# Patient Record
Sex: Female | Born: 1975 | Race: White | Hispanic: No | Marital: Married | Smoking: Never smoker
Health system: Southern US, Community
[De-identification: ages and names within clinical notes are randomized; demographics above are authoritative.]

## PROBLEM LIST (undated history)

## (undated) DIAGNOSIS — K219 Gastro-esophageal reflux disease without esophagitis: Secondary | ICD-10-CM

## (undated) DIAGNOSIS — C801 Malignant (primary) neoplasm, unspecified: Secondary | ICD-10-CM

## (undated) HISTORY — DX: Gastro-esophageal reflux disease without esophagitis: K21.9

## (undated) HISTORY — DX: Malignant (primary) neoplasm, unspecified: C80.1

## (undated) HISTORY — PX: UPPER GI ENDOSCOPY: SHX6162

---

## 2004-09-10 ENCOUNTER — Emergency Department: Payer: Self-pay | Admitting: Emergency Medicine

## 2005-08-29 ENCOUNTER — Emergency Department: Payer: Self-pay | Admitting: Emergency Medicine

## 2007-03-14 ENCOUNTER — Inpatient Hospital Stay: Payer: Self-pay

## 2007-10-03 IMAGING — CR DG CHEST 2V
1 series · 2 of 2 positions shown · non-contrast
Comparison: none

REASON FOR EXAM: Pain in sternum
COMMENTS:  LMP: Two weeks ago

PROCEDURE:     DXR - DXR CHEST PA (OR AP) AND LATERAL  - August 29, 2005  [DATE]
RESULT:       Incidental note is made to mild levoscoliosis of the mid to
lower thoracic spine.  Otherwise, unremarkable chest radiograph.

[Series 3637: postero_anterior · 0.22mm/px · 2 of 2 slices shown]
[im 1/2]
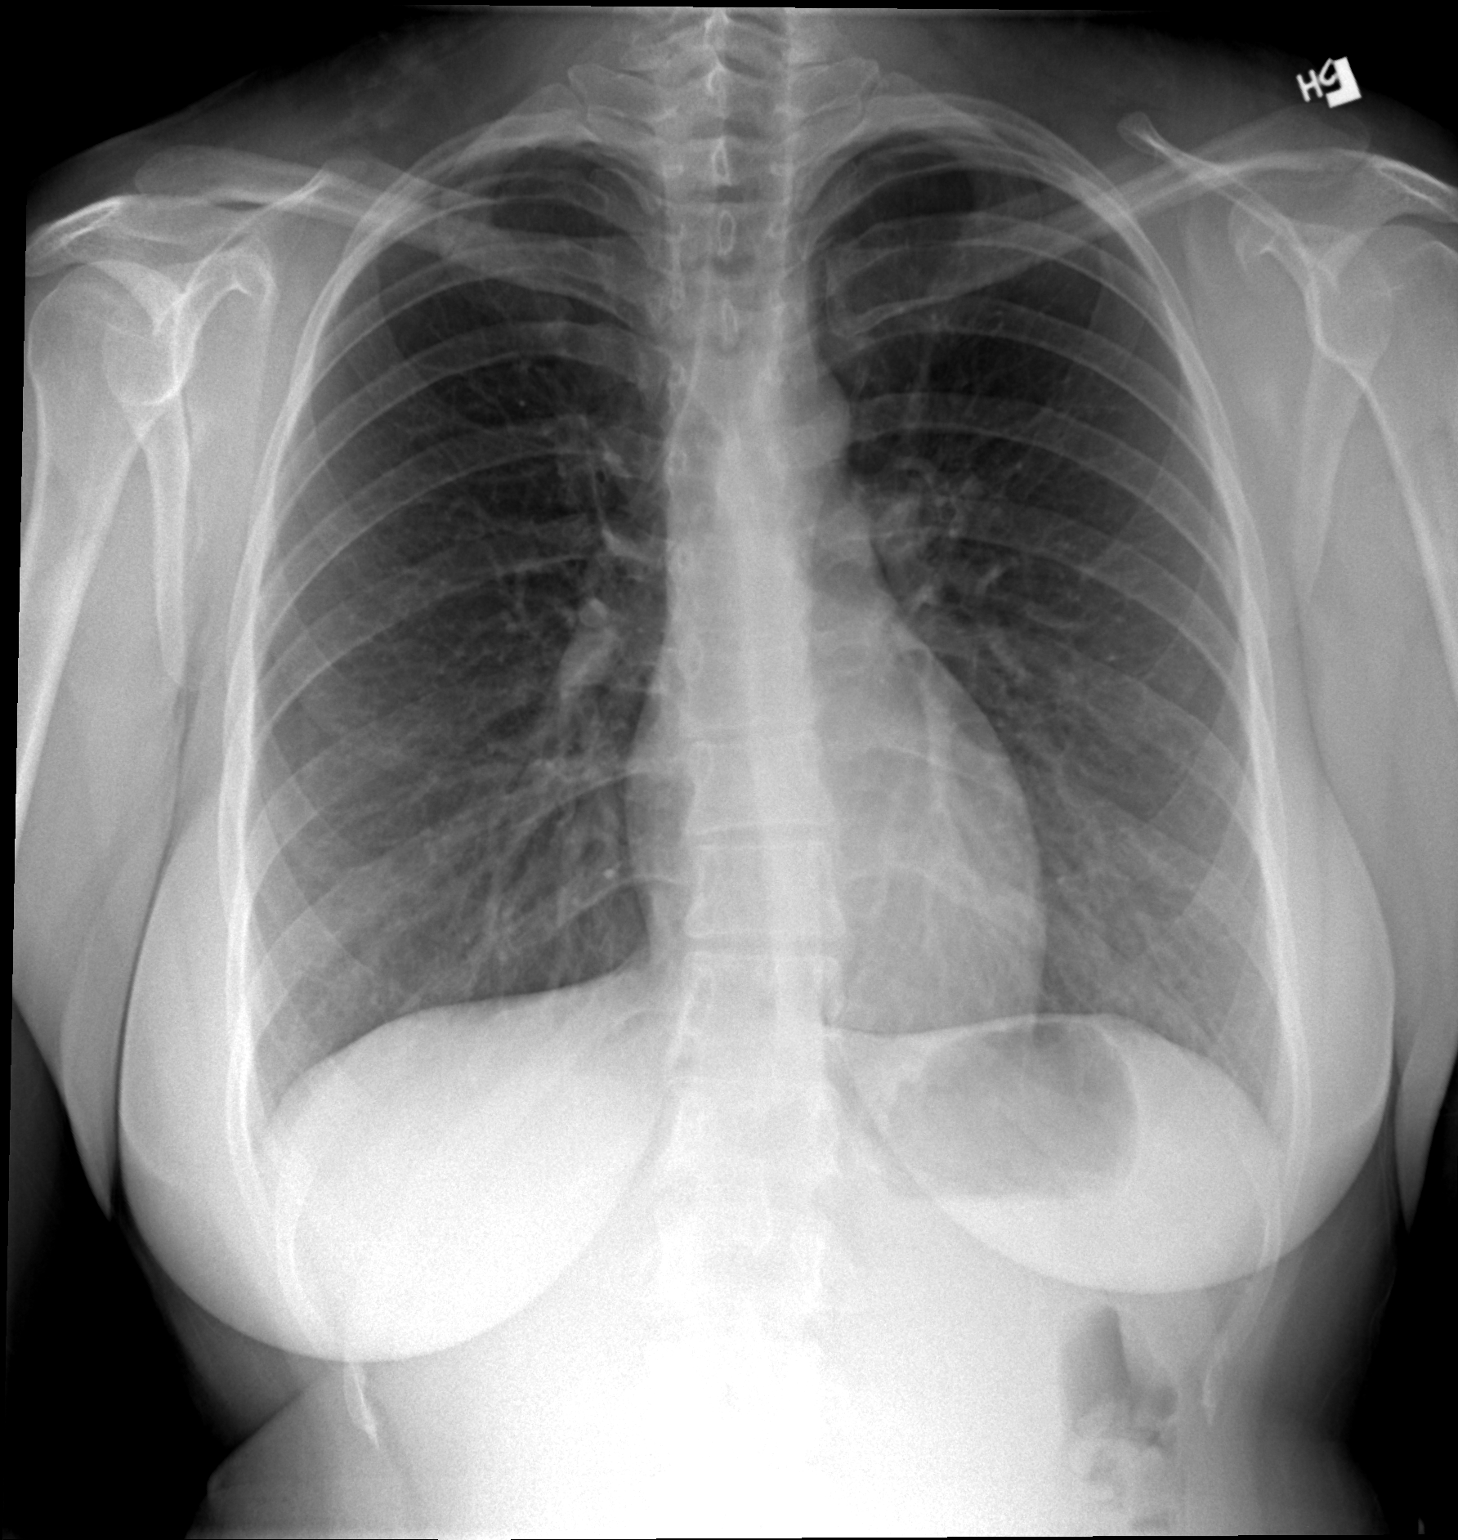
[im 2/2]
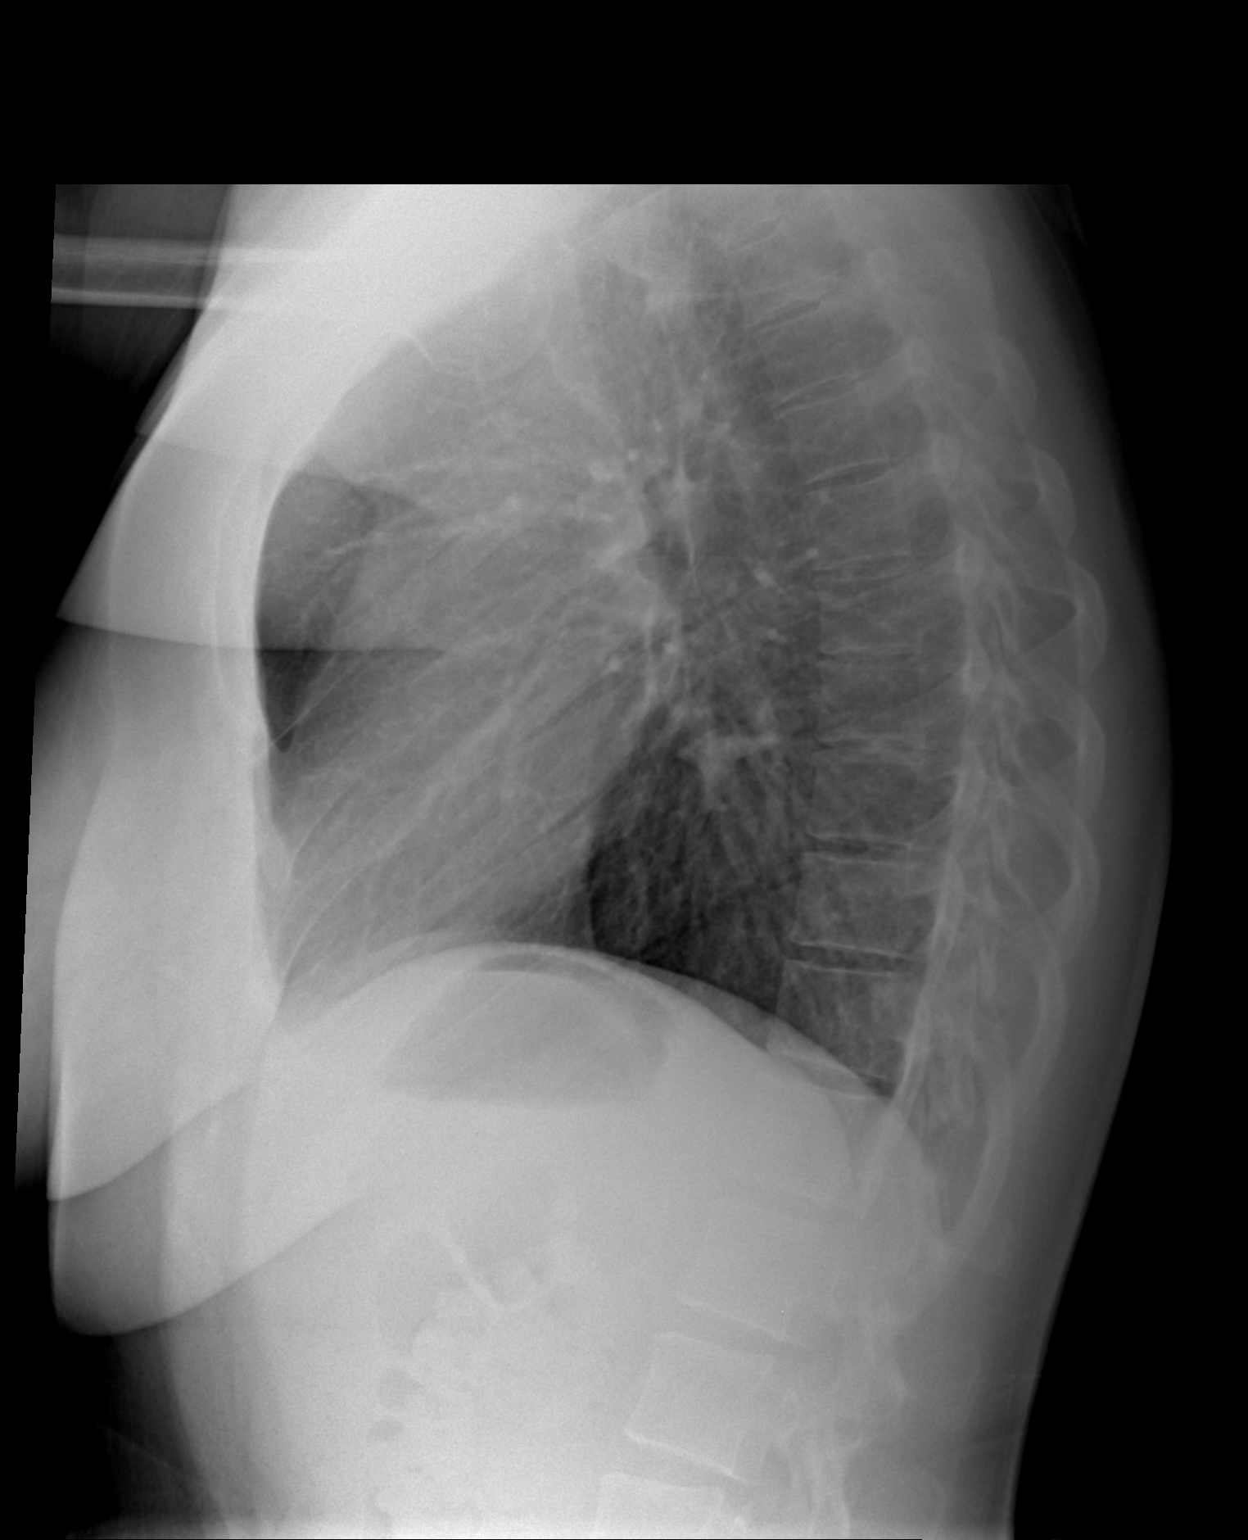

[2 of 2 positions shown; findings below may reference images not displayed]

IMPRESSION: See above.

## 2008-06-17 ENCOUNTER — Ambulatory Visit: Payer: Self-pay

## 2010-07-22 IMAGING — US ULTRASOUND RIGHT BREAST
1 series · 11 of 11 positions shown · non-contrast
Comparison: none

REASON FOR EXAM: Right breast mass between 11 and 12 o'clock  above nipple
COMMENTS:

[Series 1: ultrasound right breast · 11 of 11 slices shown]
[im 1/11]
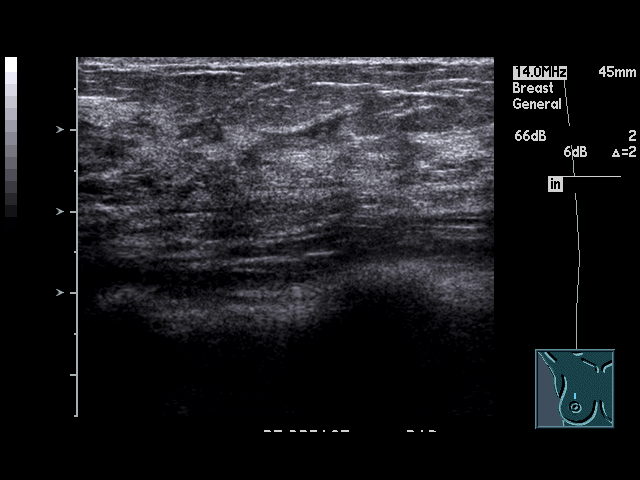
[im 2/11]
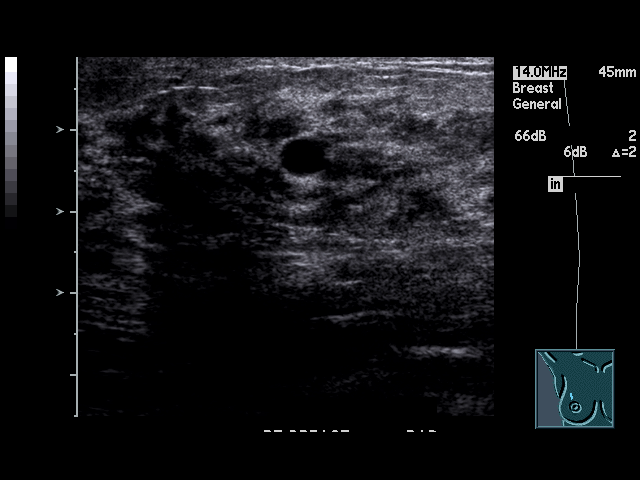
[im 3/11]
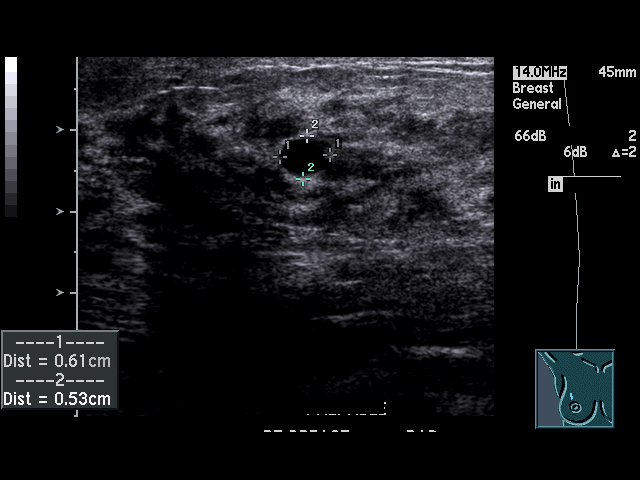
[im 4/11]
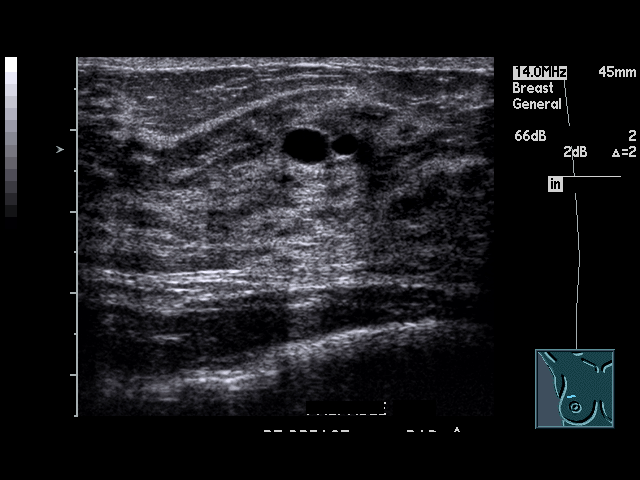
[im 5/11]
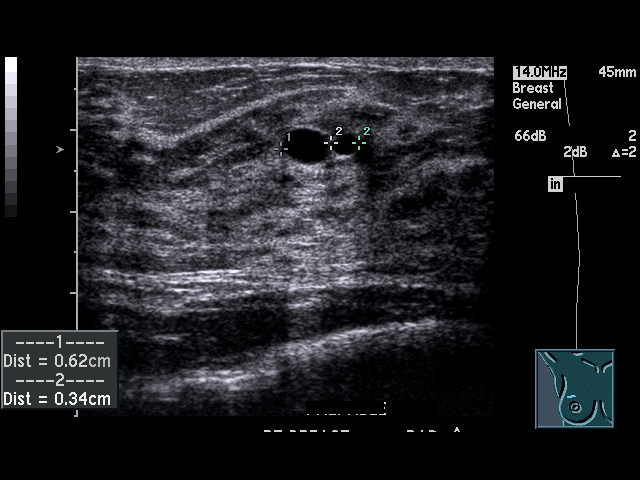
[im 6/11]
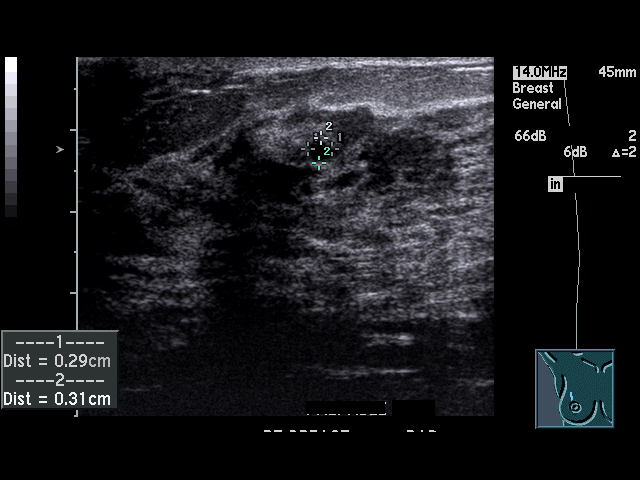
[im 7/11]
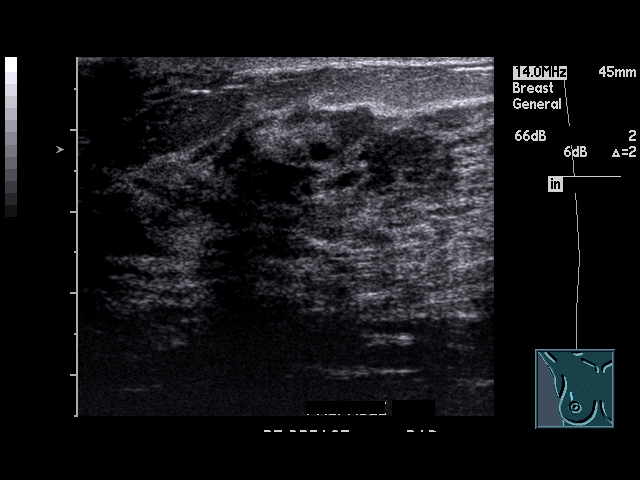
[im 8/11]
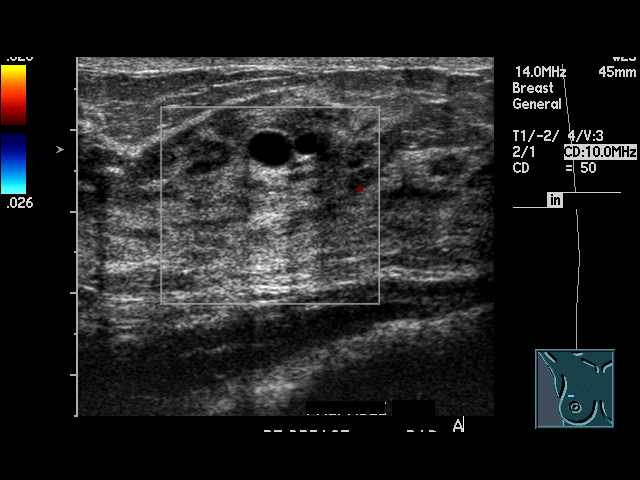
[im 9/11]
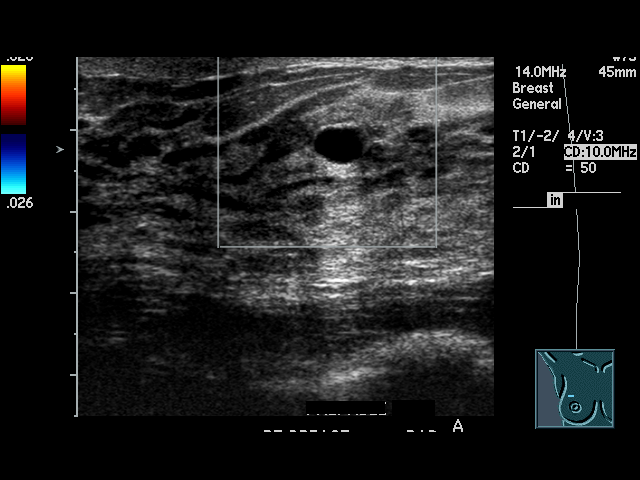
[im 10/11]
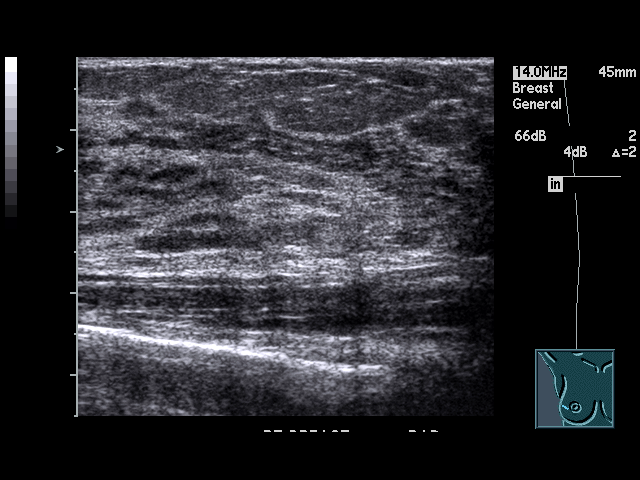
[im 11/11]
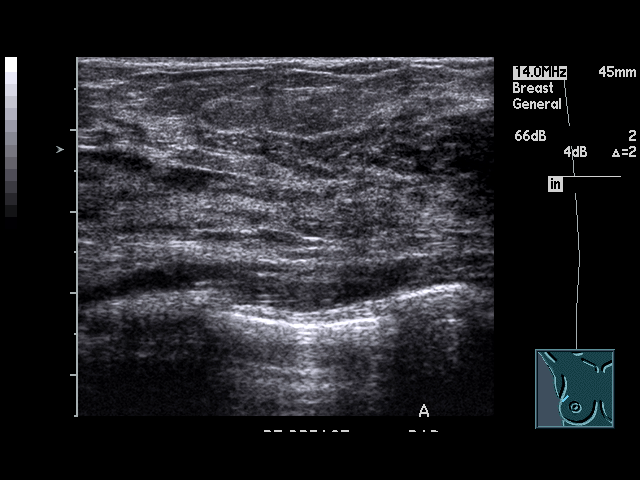

[11 of 11 positions shown; findings below may reference images not displayed]

PROCEDURE:     US  - US BREAST RIGHT  - June 17, 2008  [DATE]

RESULT:     The patient has an area of palpable nodularity between the 11
and 12 o'clock position above the nipple on the RIGHT.

Ultrasound performed in this region reveals two, simple appearing cystic
structures in the area of the palpable findings. The larger measures
approximately 6.0 mm in diameter with the smaller measuring approximately
3.0 mm in diameter. They do demonstrate enhanced through transmission. No
suspicious appearing solid components are identified.
IMPRESSION: There are two, simple appearing, cystic structures in the
area of the palpable abnormality in the RIGHT periareolar region
superolaterally. Please see the dictation of the mammogram of this same day
for final recommendations and BI-RADS classification.

## 2015-01-13 ENCOUNTER — Ambulatory Visit: Payer: BLUE CROSS/BLUE SHIELD | Admitting: Family Medicine

## 2015-01-17 ENCOUNTER — Encounter: Payer: Self-pay | Admitting: Family Medicine

## 2015-01-17 ENCOUNTER — Ambulatory Visit (INDEPENDENT_AMBULATORY_CARE_PROVIDER_SITE_OTHER): Payer: BLUE CROSS/BLUE SHIELD | Admitting: Family Medicine

## 2015-01-17 ENCOUNTER — Other Ambulatory Visit: Payer: Self-pay | Admitting: Family Medicine

## 2015-01-17 VITALS — BP 116/72 | HR 107 | Temp 98.2°F | Resp 16 | Ht 67.0 in | Wt 189.4 lb

## 2015-01-17 DIAGNOSIS — E663 Overweight: Secondary | ICD-10-CM | POA: Diagnosis not present

## 2015-01-17 DIAGNOSIS — Z85828 Personal history of other malignant neoplasm of skin: Secondary | ICD-10-CM | POA: Insufficient documentation

## 2015-01-17 DIAGNOSIS — K219 Gastro-esophageal reflux disease without esophagitis: Secondary | ICD-10-CM | POA: Insufficient documentation

## 2015-01-17 MED ORDER — PHENDIMETRAZINE TARTRATE 35 MG PO TABS: 1.0000 | ORAL_TABLET | Freq: Every day | ORAL | Status: DC | PRN

## 2015-01-17 MED ORDER — LORCASERIN HCL 10 MG PO TABS: 1.0000 | ORAL_TABLET | Freq: Two times a day (BID) | ORAL | Status: DC

## 2015-01-17 NOTE — Progress Notes (Signed)
Name: Briana Cabrera   MRN: [id number]    DOB: 10-Jun-1976   Date:01/17/2015       Progress Note  Subjective  Chief Complaint  Chief Complaint  Patient presents with  . Medication Refill    patient stated that the change of medication has really helped her.    HPI  Patient is here for routine follow up of Obesity. The patient is appropriately concerned about their weight. Onset of weight issues started a few years ago. Highest recorded weight 189 lbs. Goal weight loss is 20-25lbs. Associated symptoms or diseases include no pain in weight bearing joints, HTN, HLD, DMII, depression, poor self esteem, snoring, sleep apnea). Current efforts to reduce weight include regular exercise and diet modification and prescription Phendimetrazine. She reports no side effects from her medication. Describe current exercise regimen: infrequently Describe current diet regimen: balanced diet but does have a hard time with carbohydrate restriction consistently.    Past Medical History  Diagnosis Date  . Cancer     skin  . GERD (gastroesophageal reflux disease)     History reviewed. No pertinent past surgical history.  Family History  Problem Relation Age of Onset  . Hypertension Mother   . Heart disease Father   . Hypertension Father   . Diabetes Father   . Heart disease Brother   . Hypertension Brother   . Diabetes Brother     History   Social History  . Marital Status: Married    Spouse Name: N/A  . Number of Children: 1  . Years of Education: N/A   Occupational History  . Patient Care Coordinator     [employer]   Social History Main Topics  . Smoking status: Never Smoker   . Smokeless tobacco: Not on file  . Alcohol Use: No  . Drug Use: No  . Sexual Activity:    Partners: Male    Birth Control/ Protection: IUD   Other Topics Concern  . Not on file   Social History Narrative  . No narrative on file     Current outpatient prescriptions:  .  doxycycline (MONODOX) 100  MG capsule, Take 1 capsule by mouth daily., Disp: , Rfl: 0 .  hydrocortisone 2.5 % cream, Apply 1 application topically daily., Disp: , Rfl: 0 .  omeprazole (PRILOSEC) 40 MG capsule, Take 1 capsule by mouth daily., Disp: , Rfl:  .  Phendimetrazine Tartrate 35 MG TABS, Take 1 tablet (35 mg total) by mouth daily as needed., Disp: 30 each, Rfl: 1 .  ketoconazole (NIZORAL) 2 % shampoo, Apply 1 application topically 2 (two) times a week., Disp: , Rfl: 0 .  Lorcaserin HCl (BELVIQ) 10 MG TABS, Take 1 tablet by mouth 2 (two) times daily., Disp: 60 tablet, Rfl: 2  No Known Allergies   ROS  CONSTITUTIONAL: No significant weight changes, fever, chills, weakness or fatigue.  HEENT:  - Eyes: No visual changes.  - Ears: No auditory changes. No pain.  - Nose: No sneezing, congestion, runny nose. - Throat: No sore throat. No changes in swallowing. SKIN: No rash or itching.  CARDIOVASCULAR: No chest pain, chest pressure or chest discomfort. No palpitations or edema.  RESPIRATORY: No shortness of breath, cough or sputum.  GASTROINTESTINAL: No anorexia, nausea, vomiting. No changes in bowel habits. No abdominal pain or blood.  GENITOURINARY: No dysuria. No frequency. No discharge.  NEUROLOGICAL: No headache, dizziness, syncope, paralysis, ataxia, numbness or tingling in the extremities. No memory changes. No change in bowel or  bladder control.  MUSCULOSKELETAL: No joint pain. No muscle pain. HEMATOLOGIC: No anemia, bleeding or bruising.  LYMPHATICS: No enlarged lymph nodes.  PSYCHIATRIC: No change in mood. No change in sleep pattern.  ENDOCRINOLOGIC: No reports of sweating, cold or heat intolerance. No polyuria or polydipsia.   Objective  Filed Vitals:   01/17/15 1142  BP: 116/72  Pulse: 107  Temp: 98.2 F (36.8 C)  TempSrc: Oral  Resp: 16  Height: 5\' 7"  (1.702 m)  Weight: 189 lb 6.4 oz (85.911 kg)  SpO2: 96%    Physical Exam  Constitutional: Patient appears well-developed and  well-nourished. In no distress.  HEENT:  - Head: Normocephalic and atraumatic.  - Ears: Bilateral TMs gray, no erythema or effusion - Nose: Nasal mucosa moist - Mouth/Throat: Oropharynx is clear and moist. No tonsillar hypertrophy or erythema. No post nasal drainage.  - Eyes: Conjunctivae clear, EOM movements normal. PERRLA. No scleral icterus.  Neck: Normal range of motion. Neck supple. No JVD present. No thyromegaly present.  Cardiovascular: Normal rate, regular rhythm and normal heart sounds.  No murmur heard.  Pulmonary/Chest: Effort normal and breath sounds normal. No respiratory distress. Musculoskeletal: Normal range of motion bilateral UE and LE, no joint effusions. Peripheral vascular: Bilateral LE no edema. Neurological: CN II-XII grossly intact with no focal deficits. Alert and oriented to person, place, and time. Coordination, balance, strength, speech and gait are normal.  Skin: Skin is warm and dry. No rash noted. No erythema.  Psychiatric: Patient has a normal mood and affect. Behavior is normal in office today. Judgment and thought content normal in office today.   Assessment & Plan  1. Overweight The patient has been counseled on their higher than normal BMI.  They have verbally expressed understanding their increased risk for other diseases.  In efforts to meet a better target BMI goal the patient has been counseled on lifestyle, diet and exercise modification tactics. Start with moderate intensity aerobic exercise (walking, jogging, elliptical, swimming, group or individual sports, hiking) at least 13mins a day at least 4 days a week and increase intensity, duration, frequency as tolerated. Diet should include well balance fresh fruits and vegetables avoiding processed foods, carbohydrates and sugars. Drink at least 8oz 10 glasses a day avoiding sodas, sugary fruit drinks, sweetened tea. Check weight on a reliable scale daily and monitor weight loss progress daily. Consider  investing in mobile phone apps that will help keep track of weight loss goals. Referred to lifestyle center for nutritional counseling. Reduced Phendimetrazine use to just prior to workout days. Trial of Belviq.   - Amb ref to [facility] - Phendimetrazine Tartrate 35 MG TABS; Take 1 tablet (35 mg total) by mouth daily as needed.  Dispense: 30 each; Refill: 1 - Lorcaserin HCl (BELVIQ) 10 MG TABS; Take 1 tablet by mouth 2 (two) times daily.  Dispense: 60 tablet; Refill: 2

## 2015-01-17 NOTE — Patient Instructions (Signed)
Fat and Cholesterol Control Diet Fat and cholesterol levels in your blood and organs are influenced by your diet. High levels of fat and cholesterol may lead to diseases of the heart, small and large blood vessels, gallbladder, liver, and pancreas. CONTROLLING FAT AND CHOLESTEROL WITH DIET Although exercise and lifestyle factors are important, your diet is key. That is because certain foods are known to raise cholesterol and others to lower it. The goal is to balance foods for their effect on cholesterol and more importantly, to replace saturated and trans fat with other types of fat, such as monounsaturated fat, polyunsaturated fat, and omega-3 fatty acids. On average, a person should consume no more than 15 to 17 g of saturated fat daily. Saturated and trans fats are considered "bad" fats, and they will raise LDL cholesterol. Saturated fats are primarily found in animal products such as meats, butter, and cream. However, that does not mean you need to give up all your favorite foods. Today, there are good tasting, low-fat, low-cholesterol substitutes for most of the things you like to eat. Choose low-fat or nonfat alternatives. Choose round or loin cuts of red meat. These types of cuts are lowest in fat and cholesterol. Chicken (without the skin), fish, veal, and ground turkey breast are great choices. Eliminate fatty meats, such as hot dogs and salami. Even shellfish have little or no saturated fat. Have a 3 oz (85 g) portion when you eat lean meat, poultry, or fish. Trans fats are also called "partially hydrogenated oils." They are oils that have been scientifically manipulated so that they are solid at room temperature resulting in a longer shelf life and improved taste and texture of foods in which they are added. Trans fats are found in stick margarine, some tub margarines, cookies, crackers, and baked goods.  When baking and cooking, oils are a great substitute for butter. The monounsaturated oils are  especially beneficial since it is believed they lower LDL and raise HDL. The oils you should avoid entirely are saturated tropical oils, such as coconut and palm.  Remember to eat a lot from food groups that are naturally free of saturated and trans fat, including fish, fruit, vegetables, beans, grains (barley, rice, couscous, bulgur wheat), and pasta (without cream sauces).  IDENTIFYING FOODS THAT LOWER FAT AND CHOLESTEROL  Soluble fiber may lower your cholesterol. This type of fiber is found in fruits such as apples, vegetables such as broccoli, potatoes, and carrots, legumes such as beans, peas, and lentils, and grains such as barley. Foods fortified with plant sterols (phytosterol) may also lower cholesterol. You should eat at least 2 g per day of these foods for a cholesterol lowering effect.  Read package labels to identify low-saturated fats, trans fat free, and low-fat foods at the supermarket. Select cheeses that have only 2 to 3 g saturated fat per ounce. Use a heart-healthy tub margarine that is free of trans fats or partially hydrogenated oil. When buying baked goods (cookies, crackers), avoid partially hydrogenated oils. Breads and muffins should be made from whole grains (whole-wheat or whole oat flour, instead of "flour" or "enriched flour"). Buy non-creamy canned soups with reduced salt and no added fats.  FOOD PREPARATION TECHNIQUES  Never deep-fry. If you must fry, either stir-fry, which uses very little fat, or use non-stick cooking sprays. When possible, broil, bake, or roast meats, and steam vegetables. Instead of putting butter or margarine on vegetables, use lemon and herbs, applesauce, and cinnamon (for squash and sweet potatoes). Use nonfat   yogurt, salsa, and low-fat dressings for salads.  LOW-SATURATED FAT / LOW-FAT FOOD SUBSTITUTES Meats / Saturated Fat (g)  Avoid: Steak, marbled (3 oz/85 g) / 11 g  Choose: Steak, lean (3 oz/85 g) / 4 g  Avoid: Hamburger (3 oz/85 g) / 7  g  Choose: Hamburger, lean (3 oz/85 g) / 5 g  Avoid: Ham (3 oz/85 g) / 6 g  Choose: Ham, lean cut (3 oz/85 g) / 2.4 g  Avoid: Chicken, with skin, dark meat (3 oz/85 g) / 4 g  Choose: Chicken, skin removed, dark meat (3 oz/85 g) / 2 g  Avoid: Chicken, with skin, light meat (3 oz/85 g) / 2.5 g  Choose: Chicken, skin removed, light meat (3 oz/85 g) / 1 g Dairy / Saturated Fat (g)  Avoid: Whole milk (1 cup) / 5 g  Choose: Low-fat milk, 2% (1 cup) / 3 g  Choose: Low-fat milk, 1% (1 cup) / 1.5 g  Choose: Skim milk (1 cup) / 0.3 g  Avoid: Hard cheese (1 oz/28 g) / 6 g  Choose: Skim milk cheese (1 oz/28 g) / 2 to 3 g  Avoid: Cottage cheese, 4% fat (1 cup) / 6.5 g  Choose: Low-fat cottage cheese, 1% fat (1 cup) / 1.5 g  Avoid: Ice cream (1 cup) / 9 g  Choose: Sherbet (1 cup) / 2.5 g  Choose: Nonfat frozen yogurt (1 cup) / 0.3 g  Choose: Frozen fruit bar / trace  Avoid: Whipped cream (1 tbs) / 3.5 g  Choose: Nondairy whipped topping (1 tbs) / 1 g Condiments / Saturated Fat (g)  Avoid: Mayonnaise (1 tbs) / 2 g  Choose: Low-fat mayonnaise (1 tbs) / 1 g  Avoid: Butter (1 tbs) / 7 g  Choose: Extra light margarine (1 tbs) / 1 g  Avoid: Coconut oil (1 tbs) / 11.8 g  Choose: Olive oil (1 tbs) / 1.8 g  Choose: Corn oil (1 tbs) / 1.7 g  Choose: Safflower oil (1 tbs) / 1.2 g  Choose: Sunflower oil (1 tbs) / 1.4 g  Choose: Soybean oil (1 tbs) / 2.4 g  Choose: Canola oil (1 tbs) / 1 g Document Released: 07/12/2005 Document Revised: 11/06/2012 Document Reviewed: 10/10/2013 ExitCare Patient Information 2015 [employer]. This information is not intended to replace advice given to you by your health care provider. Make sure you discuss any questions you have with your health care provider.  

## 2015-03-14 ENCOUNTER — Telehealth: Payer: Self-pay | Admitting: Family Medicine

## 2015-03-14 DIAGNOSIS — E663 Overweight: Secondary | ICD-10-CM

## 2015-03-14 NOTE — Telephone Encounter (Signed)
PT SAID THAT THE BELVIQ IS TO EXPENSIVE AND WANTS TO KNOW IF SHE COULD TRY PHENTERMINE 30MG  EXTENDED RELEASE AGAIN.  SAID THAT SHE TOOK THIS AWHILE BACK. PHARM IS [facility] ON [address]

## 2015-03-14 NOTE — Telephone Encounter (Signed)
Phendimetrazine Tartrate 35MG , 1 (one) Tablet three times daily, #90, 30 days starting 12/20/2014, No Refill. Active. Recorded 12/20/2014 11:56 AM by Bobetta Lime, MD, Annotation/Addendum.  Phendimetrazine Tartrate ER 105MG , 1 (one) Capsule ER 24HR daily, #30, 30 days starting 10/16/2014, No Refill. Active. Recorded 10/16/2014 10:33 AM by Bobetta Lime, MD, Annotation/Addendum. Faxed to [phone], 10/16/2014 10:33 AM  Refill request was sent to Dr. Bobetta Lime for approval and submission.

## 2015-03-18 ENCOUNTER — Telehealth: Payer: Self-pay

## 2015-03-18 ENCOUNTER — Other Ambulatory Visit: Payer: Self-pay | Admitting: Family Medicine

## 2015-03-18 DIAGNOSIS — E663 Overweight: Secondary | ICD-10-CM

## 2015-03-18 MED ORDER — PHENDIMETRAZINE TARTRATE 35 MG PO TABS: 1.0000 | ORAL_TABLET | Freq: Two times a day (BID) | ORAL | Status: DC

## 2015-03-18 NOTE — Telephone Encounter (Signed)
Rx printed out and ready for pick up, let patient know needs to f/u for refills and weight documentation.

## 2015-03-18 NOTE — Telephone Encounter (Signed)
Patient was informed.

## 2015-03-18 NOTE — Telephone Encounter (Signed)
Patient was informed that her rx was ready for pick up.

## 2015-05-19 ENCOUNTER — Encounter: Payer: BLUE CROSS/BLUE SHIELD | Admitting: Family Medicine

## 2015-05-23 ENCOUNTER — Ambulatory Visit (INDEPENDENT_AMBULATORY_CARE_PROVIDER_SITE_OTHER): Payer: BLUE CROSS/BLUE SHIELD | Admitting: Family Medicine

## 2015-05-23 ENCOUNTER — Encounter: Payer: Self-pay | Admitting: Family Medicine

## 2015-05-23 VITALS — BP 110/74 | HR 124 | Temp 98.4°F | Resp 16 | Wt 193.3 lb

## 2015-05-23 DIAGNOSIS — E663 Overweight: Secondary | ICD-10-CM | POA: Diagnosis not present

## 2015-05-23 DIAGNOSIS — Z13220 Encounter for screening for lipoid disorders: Secondary | ICD-10-CM | POA: Diagnosis not present

## 2015-05-23 DIAGNOSIS — Z136 Encounter for screening for cardiovascular disorders: Secondary | ICD-10-CM

## 2015-05-23 DIAGNOSIS — R5383 Other fatigue: Secondary | ICD-10-CM | POA: Diagnosis not present

## 2015-05-23 DIAGNOSIS — R000 Tachycardia, unspecified: Secondary | ICD-10-CM

## 2015-05-23 DIAGNOSIS — IMO0001 Reserved for inherently not codable concepts without codable children: Secondary | ICD-10-CM | POA: Insufficient documentation

## 2015-05-23 MED ORDER — PHENTERMINE HCL 37.5 MG PO CAPS: 37.5000 mg | ORAL_CAPSULE | Freq: Every day | ORAL | Status: DC

## 2015-05-23 NOTE — Progress Notes (Signed)
Name: Briana Cabrera   MRN: [id number]    DOB: 04/27/1976   Date:05/23/2015       Progress Note  Subjective  Chief Complaint  Chief Complaint  Patient presents with  . Labs Only  . Obesity    patient is here for a weight management follow-up    HPI  Briana Cabrera is a 39 year old female here today for follow up of her weight and she is requesting general lab work. The patient is appropriately concerned about their weight. Onset of weight issues started a few years ago. Highest recorded weight is today's 193 lbs, she was 189 lbs at our last visit. Goal weight loss is 20-25lbs. Associated symptoms or diseases include no pain in weight bearing joints, HTN, HLD, DMII, depression, poor self esteem, snoring, sleep apnea. Current efforts to reduce weight include regular exercise and diet modification and prescription Belviq which is not working to help with weight loss she states. She reports no side effects from her medication.  Describe current exercise regimen: infrequently due to daily fatigue. Reports taking B12 shots in the past as a bariatric clinic. Describe current diet regimen: balanced diet but does have a hard time with carbohydrate restriction consistently.   Past Medical History  Diagnosis Date  . Cancer ([place])     skin  . GERD (gastroesophageal reflux disease)     Patient Active Problem List   Diagnosis Date Noted  . Encounter for cholesteral screening for cardiovascular disease 05/23/2015  . Other fatigue 05/23/2015  . Sinus tachycardia ([place]) 05/23/2015  . History of skin cancer 01/17/2015  . Overweight 01/17/2015  . GERD without esophagitis 01/17/2015    Social History  Substance Use Topics  . Smoking status: Never Smoker   . Smokeless tobacco: Not on file  . Alcohol Use: No     Current outpatient prescriptions:  .  omeprazole (PRILOSEC) 40 MG capsule, Take 1 capsule by mouth daily., Disp: , Rfl:  .  hydrocortisone 2.5 % cream, Apply 1 application topically  daily., Disp: , Rfl: 0 .  Lorcaserin HCl (BELVIQ) 10 MG TABS, Take 1 tablet by mouth 2 (two) times daily. (Patient not taking: Reported on 05/23/2015), Disp: 60 tablet, Rfl: 2 .  Phendimetrazine Tartrate 35 MG TABS, Take 1 tablet (35 mg total) by mouth 2 (two) times daily. (Patient not taking: Reported on 05/23/2015), Disp: 60 each, Rfl: 0  History reviewed. No pertinent past surgical history.  Family History  Problem Relation Age of Onset  . Hypertension Mother   . Heart disease Father   . Hypertension Father   . Diabetes Father   . Heart disease Brother   . Hypertension Brother   . Diabetes Brother     No Known Allergies   Review of Systems  CONSTITUTIONAL: No significant weight changes, fever, chills, weakness.  HEENT:  - Eyes: No visual changes.  - Ears: No auditory changes. No pain.  - Nose: No sneezing, congestion, runny nose. - Throat: No sore throat. No changes in swallowing. SKIN: No rash or itching.  CARDIOVASCULAR: No chest pain, chest pressure or chest discomfort. No palpitations or edema.  RESPIRATORY: No shortness of breath, cough or sputum.  PSYCHIATRIC: No change in mood. No change in sleep pattern.  ENDOCRINOLOGIC: No reports of sweating, cold or heat intolerance. No polyuria or polydipsia.     Objective  BP 110/74 mmHg  Pulse 124  Temp(Src) 98.4 F (36.9 C) (Oral)  Resp 16  Wt 193 lb 4.8 oz (87.68  kg)  SpO2 98% Body mass index is 30.27 kg/(m^2).  Physical Exam  Constitutional: Patient is overweight and well-nourished. In no distress.  Neck: Normal range of motion. Neck supple. No JVD present. No thyromegaly present.  Cardiovascular: Normal rate, regular rhythm and normal heart sounds.  No murmur heard.  Pulmonary/Chest: Effort normal and breath sounds normal. No respiratory distress. Musculoskeletal: Normal range of motion bilateral UE and LE, no joint effusions. Peripheral vascular: Bilateral LE no edema. Neurological: CN II-XII grossly intact  with no focal deficits. Alert and oriented to person, place, and time. Coordination, balance, strength, speech and gait are normal.  Skin: Skin is warm and dry. No rash noted. No erythema.  Psychiatric: Patient has a normal mood and affect. Behavior is normal in office today. Judgment and thought content normal in office today.   Assessment & Plan  1. Overweight Discussed diet tactics in detail today. Stop Belviq. Re-start Adipex. The patient has been counseled on the proper use, side effects and potential interactions of the new medication. Patient encouraged to review the side effects and safety profile pamphlet provided with the prescription from the pharmacy as well as request counseling from the pharmacy team as needed.    The patient has been counseled on their higher than normal BMI.  They have verbally expressed understanding their increased risk for other diseases.  In efforts to meet a better target BMI goal the patient has been counseled on lifestyle, diet and exercise modification tactics. Start with moderate intensity aerobic exercise (walking, jogging, elliptical, swimming, group or individual sports, hiking) at least 63mins a day at least 4 days a week and increase intensity, duration, frequency as tolerated. Diet should include well balance fresh fruits and vegetables avoiding processed foods, carbohydrates and sugars. Drink at least 8oz 10 glasses a day avoiding sodas, sugary fruit drinks, sweetened tea. Check weight on a reliable scale daily and monitor weight loss progress daily. Consider investing in mobile phone apps that will help keep track of weight loss goals.  - phentermine 37.5 MG capsule; Take 1 capsule (37.5 mg total) by mouth daily.  Dispense: 30 capsule; Refill: 0 - CBC with Differential/Platelet - Comprehensive metabolic panel - TSH  2. Sinus tachycardia ([place]) Resolved on clinical exam.  3. Encounter for cholesteral screening for cardiovascular disease  - Lipid  panel  4. Other fatigue Start with blood work. Exercise may help.  - CBC with Differential/Platelet - Comprehensive metabolic panel - TSH - K15

## 2015-05-24 LAB — COMPREHENSIVE METABOLIC PANEL
ALK PHOS: 69 IU/L (ref 39–117)
ALT: 13 IU/L (ref 0–32)
AST: 12 IU/L (ref 0–40)
Albumin/Globulin Ratio: 1.7 (ref 1.1–2.5)
Albumin: 4.3 g/dL (ref 3.5–5.5)
BILIRUBIN TOTAL: 0.3 mg/dL (ref 0.0–1.2)
BUN / CREAT RATIO: 15 (ref 8–20)
BUN: 12 mg/dL (ref 6–20)
CHLORIDE: 102 mmol/L (ref 97–106)
CO2: 24 mmol/L (ref 18–29)
CREATININE: 0.81 mg/dL (ref 0.57–1.00)
Calcium: 9.3 mg/dL (ref 8.7–10.2)
GFR calc Af Amer: 106 mL/min/{1.73_m2} (ref >59)
GFR calc non Af Amer: 92 mL/min/{1.73_m2} (ref >59)
GLUCOSE: 94 mg/dL (ref 65–99)
Globulin, Total: 2.6 g/dL (ref 1.5–4.5)
Potassium: 5.2 mmol/L (ref 3.5–5.2)
Sodium: 141 mmol/L (ref 136–144)
Total Protein: 6.9 g/dL (ref 6.0–8.5)

## 2015-05-24 LAB — LIPID PANEL
CHOLESTEROL TOTAL: 167 mg/dL (ref 100–199)
Chol/HDL Ratio: 2.6 ratio units (ref 0.0–4.4)
HDL: 65 mg/dL (ref >39)
LDL Calculated: 88 mg/dL (ref 0–99)
TRIGLYCERIDES: 71 mg/dL (ref 0–149)
VLDL CHOLESTEROL CAL: 14 mg/dL (ref 5–40)

## 2015-05-24 LAB — CBC WITH DIFFERENTIAL/PLATELET
BASOS ABS: 0 10*3/uL (ref 0.0–0.2)
Basos: 1 %
EOS (ABSOLUTE): 0.1 10*3/uL (ref 0.0–0.4)
Eos: 1 %
Hematocrit: 41.7 % (ref 34.0–46.6)
Hemoglobin: 13.9 g/dL (ref 11.1–15.9)
Immature Grans (Abs): 0 10*3/uL (ref 0.0–0.1)
Immature Granulocytes: 0 %
LYMPHS ABS: 2 10*3/uL (ref 0.7–3.1)
LYMPHS: 26 %
MCH: 29.8 pg (ref 26.6–33.0)
MCHC: 33.3 g/dL (ref 31.5–35.7)
MCV: 89 fL (ref 79–97)
Monocytes Absolute: 0.7 10*3/uL (ref 0.1–0.9)
Monocytes: 10 %
NEUTROS ABS: 4.8 10*3/uL (ref 1.4–7.0)
Neutrophils: 62 %
PLATELETS: 334 10*3/uL (ref 150–379)
RBC: 4.67 x10E6/uL (ref 3.77–5.28)
RDW: 13 % (ref 12.3–15.4)
WBC: 7.6 10*3/uL (ref 3.4–10.8)

## 2015-05-24 LAB — VITAMIN B12: Vitamin B-12: 727 pg/mL (ref 211–946)

## 2015-05-24 LAB — TSH: TSH: 1.94 u[IU]/mL (ref 0.450–4.500)

## 2015-07-01 ENCOUNTER — Telehealth: Payer: Self-pay | Admitting: Family Medicine

## 2015-07-01 DIAGNOSIS — E663 Overweight: Secondary | ICD-10-CM

## 2015-07-01 NOTE — Telephone Encounter (Signed)
Refill request was sent to Dr. Ashany Sundaram for approval and submission.  

## 2015-07-01 NOTE — Telephone Encounter (Signed)
PT SAID THAT THE MEDICATION FOR WEIGHT LOSS IS WORKING FINE. SAID THAT YOU TOLD HER THAT YOU WOULD GIVE HER A 30 DAY SUPPLY IF IS WORKING. PHARM IS [facility] [address] .

## 2015-07-02 MED ORDER — PHENTERMINE HCL 37.5 MG PO CAPS: 37.5000 mg | ORAL_CAPSULE | Freq: Every day | ORAL | Status: DC

## 2015-07-02 NOTE — Telephone Encounter (Signed)
Rx printed out

## 2015-07-08 ENCOUNTER — Telehealth: Payer: Self-pay | Admitting: Family Medicine

## 2015-07-08 ENCOUNTER — Other Ambulatory Visit: Payer: Self-pay | Admitting: Family Medicine

## 2015-07-08 NOTE — Telephone Encounter (Signed)
Pt called and was informed of RX being sent to [facility]

## 2015-07-08 NOTE — Telephone Encounter (Signed)
Rx printed out to be called in 07/02/15, verify with pharmacy if Rx there and then let patient know she can request refill up to 3 months in a row then needs to follow up. (next f/u should be around Feb 2017)

## 2015-07-08 NOTE — Telephone Encounter (Signed)
Pt said that the dr told her if the medication for weight loss was worling just to give her a call and the dr would right a refiil for 3 refills on this. Pt called last week also and has not heard back yet.Pharm is [facility] on [address]

## 2015-07-08 NOTE — Telephone Encounter (Signed)
Tried to contact this patient to inform her that her prescription was called into [facility] on 07/02/15 and is ready for pick-up. I called them and got confirmation that it is there and she can pick it up.

## 2015-07-08 NOTE — Telephone Encounter (Signed)
Tried to contact her to inform her that her rx is ready and is waiting for her to pick it up at [facility].

## 2015-08-06 ENCOUNTER — Telehealth: Payer: Self-pay | Admitting: Family Medicine

## 2015-08-06 NOTE — Telephone Encounter (Signed)
NEEDS REFILL ON HER WEIGHT LOSS MEDICATION. SAID THAT SHE WAS TOLD THAT SHE WOULD HAVE 3 REFILLS BUT ONLY HAS ONE. PHARM IS [facility] [address].

## 2015-08-12 ENCOUNTER — Other Ambulatory Visit: Payer: Self-pay | Admitting: Family Medicine

## 2015-08-12 DIAGNOSIS — E663 Overweight: Secondary | ICD-10-CM

## 2015-08-12 MED ORDER — PHENTERMINE HCL 37.5 MG PO CAPS: 37.5000 mg | ORAL_CAPSULE | Freq: Every day | ORAL | Status: DC

## 2015-08-12 NOTE — Telephone Encounter (Signed)
3 Month supply printed out.

## 2015-09-04 ENCOUNTER — Telehealth: Payer: Self-pay | Admitting: Family Medicine

## 2015-09-04 NOTE — Telephone Encounter (Signed)
Patient scheduled appointment for the end of the month. She is requesting a refill on Phentermine. Requesting a return call once ready

## 2015-09-04 NOTE — Telephone Encounter (Signed)
Called patient to contct pharmacy about refill it was sent in on 08/12/15 with 3 refills

## 2015-09-17 ENCOUNTER — Ambulatory Visit: Payer: Medicaid Other | Admitting: Obstetrics and Gynecology

## 2015-09-23 ENCOUNTER — Ambulatory Visit: Payer: Medicaid Other | Admitting: Family Medicine

## 2015-10-24 ENCOUNTER — Telehealth: Payer: Self-pay | Admitting: Family Medicine

## 2015-10-24 NOTE — Telephone Encounter (Signed)
Please return patient call. She is not able to come in to be seen until the end of April and was last seen in November. She is wanting a refill on Phentermine. When I look in the chart it shows she should have one for march but the pharmacy is telling her different. She also would like to know if the new doctor would be able to refill her medication in April giving her enough to last until her appt time.

## 2015-10-27 NOTE — Telephone Encounter (Signed)
Called left patient voicemail that last refill should of beeen 3/17 and would need to call a few day before 4/17 for a refill.

## 2015-11-12 ENCOUNTER — Telehealth: Payer: Self-pay | Admitting: Family Medicine

## 2015-11-12 DIAGNOSIS — E663 Overweight: Secondary | ICD-10-CM

## 2015-11-12 NOTE — Telephone Encounter (Signed)
I have never seen this patient and have not talked to her I do not prescribe this medicine by itself, so I won't be able to refill it for her; I'm sorry if Dr. Nadine Counts led her to believe otherwise She is welcome to schedule an appt with me to discuss other options or schedule with another provider who prescribes phentermine

## 2015-11-12 NOTE — Telephone Encounter (Signed)
Please give patient a call. Stated that you told her to give you a call when she was ready for her refill and that it had already been approved. When I asked her the name of the medication she then stated that its the only one that she takes and that she was at work (she did not give the name of the medication)

## 2015-11-17 ENCOUNTER — Ambulatory Visit: Payer: Medicaid Other | Admitting: Family Medicine

## 2015-11-17 NOTE — Telephone Encounter (Signed)
Patient has appointment scheduled for this morning.

## 2015-11-17 NOTE — Telephone Encounter (Signed)
Pt was a "NO SHOW" today for her appt.

## 2015-11-25 NOTE — Telephone Encounter (Signed)
I am not able to close this chart, will you please close it for me.

## 2016-05-07 ENCOUNTER — Emergency Department
Admission: EM | Admit: 2016-05-07 | Discharge: 2016-05-07 | Disposition: A | Discharge status: Home / Self Care | Payer: No Typology Code available for payment source | Attending: Emergency Medicine | Admitting: Emergency Medicine

## 2016-05-07 ENCOUNTER — Encounter: Payer: Self-pay | Admitting: Emergency Medicine

## 2016-05-07 DIAGNOSIS — Z5321 Procedure and treatment not carried out due to patient leaving prior to being seen by health care provider: Secondary | ICD-10-CM | POA: Diagnosis not present

## 2016-05-07 DIAGNOSIS — Y92410 Unspecified street and highway as the place of occurrence of the external cause: Secondary | ICD-10-CM | POA: Diagnosis not present

## 2016-05-07 DIAGNOSIS — M79604 Pain in right leg: Secondary | ICD-10-CM | POA: Insufficient documentation

## 2016-05-07 DIAGNOSIS — J3489 Other specified disorders of nose and nasal sinuses: Secondary | ICD-10-CM | POA: Insufficient documentation

## 2016-05-07 DIAGNOSIS — Z9189 Other specified personal risk factors, not elsewhere classified: Secondary | ICD-10-CM

## 2016-05-07 DIAGNOSIS — M79605 Pain in left leg: Secondary | ICD-10-CM | POA: Insufficient documentation

## 2016-05-07 DIAGNOSIS — Y939 Activity, unspecified: Secondary | ICD-10-CM | POA: Diagnosis not present

## 2016-05-07 DIAGNOSIS — Z79899 Other long term (current) drug therapy: Secondary | ICD-10-CM | POA: Insufficient documentation

## 2016-05-07 DIAGNOSIS — Y999 Unspecified external cause status: Secondary | ICD-10-CM | POA: Insufficient documentation
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
## 2016-05-07 NOTE — ED Notes (Signed)
Pt not in room, pt looked for on unit and pt not found, PA aware

## 2016-05-10 NOTE — ED Provider Notes (Signed)
-----------------------------------------   2:07 PM on 05/10/2016 -----------------------------------------  Per nurses notes, pt was here 10/13 and elected to leave prior to seeing a provider.    Schuyler Amor, MD 05/10/16 [phone]

## 2016-09-10 ENCOUNTER — Ambulatory Visit (INDEPENDENT_AMBULATORY_CARE_PROVIDER_SITE_OTHER): Payer: 59 | Admitting: Obstetrics and Gynecology

## 2016-09-10 ENCOUNTER — Encounter: Payer: Self-pay | Admitting: Obstetrics and Gynecology

## 2016-09-10 VITALS — BP 114/77 | HR 88 | Ht 67.0 in | Wt 208.4 lb

## 2016-09-10 DIAGNOSIS — E669 Obesity, unspecified: Secondary | ICD-10-CM

## 2016-09-10 MED ORDER — CYANOCOBALAMIN 1000 MCG/ML IJ SOLN: 1000.0000 ug | INTRAMUSCULAR | 1 refills | Status: DC

## 2016-09-10 MED ORDER — BUPROPION HCL ER (XL) 150 MG PO TB24: 150.0000 mg | ORAL_TABLET | Freq: Every day | ORAL | 3 refills | Status: DC

## 2016-09-10 MED ORDER — PHENTERMINE HCL 37.5 MG PO TABS: 37.5000 mg | ORAL_TABLET | Freq: Every day | ORAL | 2 refills | Status: DC

## 2016-09-10 NOTE — Progress Notes (Signed)
Subjective:     Patient ID: Briana Cabrera, female   DOB: 1975-08-26, 41 y.o.   MRN: [id number]  HPI Concerned about weight gain and wondering if mirena is contributing. It has been in for about 5 years and she does not have menses. Just recently starting going to gym, and modifying diet. Hasn't lost a pound. Desires trying weight loss medications again.  Also feeling more depressed again and wants to restart wellbutrin.  Review of Systems Negative except stated above in HPI.    Objective:   Physical Exam A&O x4 Well developed female in no distress Blood pressure 114/77, pulse 88, height 5\' 7"  (1.702 m), weight 208 lb 6.4 oz (94.5 kg). Thyroid normal on exam. Otherwise PE not indcated.    Assessment:     Obesity IUD check    Plan:     Restarted adipex and B12, will have co-worker give her monthly injection and weight checks and she will send me info through [place].  labs obtained RTC in 3 months to revaluated. Restarted well butrin 1 week after starting adipex.   >50 % of 15 minute visit spent in counseling. Melody Ardencroft, CNM

## 2016-10-08 ENCOUNTER — Encounter: Payer: Self-pay | Admitting: Obstetrics and Gynecology

## 2016-11-30 ENCOUNTER — Encounter: Payer: Self-pay | Admitting: Obstetrics and Gynecology

## 2017-01-07 ENCOUNTER — Encounter: Payer: 59 | Admitting: Obstetrics and Gynecology

## 2017-06-06 ENCOUNTER — Encounter: Payer: Self-pay | Admitting: Obstetrics and Gynecology

## 2017-07-13 ENCOUNTER — Ambulatory Visit (INDEPENDENT_AMBULATORY_CARE_PROVIDER_SITE_OTHER): Payer: 59 | Admitting: Obstetrics and Gynecology

## 2017-07-13 ENCOUNTER — Encounter: Payer: Self-pay | Admitting: Obstetrics and Gynecology

## 2017-07-13 VITALS — BP 123/84 | HR 77 | Wt 210.0 lb

## 2017-07-13 DIAGNOSIS — Z30433 Encounter for removal and reinsertion of intrauterine contraceptive device: Secondary | ICD-10-CM | POA: Diagnosis not present

## 2017-07-13 MED ORDER — FLUOXETINE HCL 10 MG PO CAPS: 10.0000 mg | ORAL_CAPSULE | Freq: Every day | ORAL | 3 refills | Status: DC

## 2017-07-13 NOTE — Progress Notes (Signed)
Briana Cabrera is a 41 y.o. year old G31P1001 Caucasian female who presents for removal and replacement of a Mirena IUD. She was given informed consent for removal and reinsertion of her Mirena. Her Mirena was placed 6 years ago, No LMP recorded. Patient is not currently having periods (Reason: IUD)., and her pregnancy test today was negative.  She also wants to try prozac for anxiety, stated the wellbutrin made no difference.   The risks and benefits of the method and placement have been thouroughly reviewed with the patient and all questions were answered.  Specifically the patient is aware of failure rate of 07/998, expulsion of the IUD and of possible perforation.  The patient is aware of irregular bleeding due to the method and understands the incidence of irregular bleeding diminishes with time.  Signed copy of informed consent in chart.   No LMP recorded. Patient is not currently having periods (Reason: IUD). BP 123/84   Pulse 77   Wt 210 lb (95.3 kg)   BMI 32.89 kg/m  No results found for this or any previous visit (from the past 24 hour(s)).   Appropriate time out taken. A graves speculum was placed in the vagina.  The cervix was visualized, prepped using Betadine. The strings were visible. They were grasped and the Mirena was easily removed. The cervix was then grasped with a single-tooth tenaculum. The uterus was found to be retroflexed and it sounded to 8 cm.  Mirena IUD placed per manufacturer's recommendations without complications. The strings were trimmed to 3 cm.  The patient tolerated the procedure well.   The patient was given post procedure instructions, including signs and symptoms of infection and to check for the strings after each menses or each month, and refraining from intercourse or anything in the vagina for 3 days.  She was given a Mirena care card with date Mirena placed, and date Mirena to be removed.    Azaela Caracci Rockney Ghee, CNM

## 2017-07-13 NOTE — Patient Instructions (Signed)

## 2017-08-24 ENCOUNTER — Encounter: Payer: 59 | Admitting: Obstetrics and Gynecology

## 2017-09-27 ENCOUNTER — Other Ambulatory Visit: Payer: Self-pay | Admitting: Obstetrics and Gynecology

## 2017-12-07 ENCOUNTER — Encounter: Payer: Self-pay | Admitting: Obstetrics and Gynecology

## 2018-04-14 ENCOUNTER — Encounter: Payer: Self-pay | Admitting: Obstetrics and Gynecology

## 2018-04-14 ENCOUNTER — Ambulatory Visit (INDEPENDENT_AMBULATORY_CARE_PROVIDER_SITE_OTHER): Payer: Medicaid Other | Admitting: Obstetrics and Gynecology

## 2018-04-14 ENCOUNTER — Telehealth: Payer: Self-pay | Admitting: Obstetrics and Gynecology

## 2018-04-14 VITALS — BP 118/78 | HR 98 | Ht 66.0 in | Wt 221.0 lb

## 2018-04-14 DIAGNOSIS — E669 Obesity, unspecified: Secondary | ICD-10-CM

## 2018-04-14 DIAGNOSIS — Z6835 Body mass index (BMI) 35.0-35.9, adult: Secondary | ICD-10-CM | POA: Diagnosis not present

## 2018-04-14 DIAGNOSIS — E66811 Obesity, class 1: Secondary | ICD-10-CM

## 2018-04-14 DIAGNOSIS — Z30431 Encounter for routine checking of intrauterine contraceptive device: Secondary | ICD-10-CM

## 2018-04-14 DIAGNOSIS — N760 Acute vaginitis: Secondary | ICD-10-CM

## 2018-04-14 DIAGNOSIS — B9689 Other specified bacterial agents as the cause of diseases classified elsewhere: Secondary | ICD-10-CM | POA: Diagnosis not present

## 2018-04-14 MED ORDER — CYANOCOBALAMIN 1000 MCG/ML IJ SOLN: 1000.0000 ug | INTRAMUSCULAR | 1 refills | Status: DC

## 2018-04-14 MED ORDER — CLINDAMYCIN PHOSPHATE (1 DOSE) 2 % VA CREA: TOPICAL_CREAM | VAGINAL | 3 refills | Status: DC

## 2018-04-14 MED ORDER — PHENDIMETRAZINE TARTRATE ER 105 MG PO CP24: 1.0000 | ORAL_CAPSULE | Freq: Every day | ORAL | 1 refills | Status: DC

## 2018-04-14 NOTE — Telephone Encounter (Signed)
I don't think the TID dosing is best for her as I think it will affect her sleep and in general not work as well.  Let's try at least one month of the ER and can go from there.

## 2018-04-14 NOTE — Telephone Encounter (Signed)
pls advise

## 2018-04-14 NOTE — Progress Notes (Signed)
  Subjective:     Patient ID: Briana Cabrera, female   DOB: 22-Apr-1976, 42 y.o.   MRN: [id number]  HPI  Here to discuss possibly removing IUD due to re-occuring BV. Last treated 2 months ago with Flagyl PO but always notices increased vaginal discharge with mild odor- worse after sex. This has been since new Mirena was placed in December 2018.   also concerned about weight gain. Desires possibly restarting weight loss medication, but states it wore off around lunch the last time she took it. She also is not exercising but states still is watching what she eats for the most part. Concerned that mirena might be causing weight gain too.  Review of Systems  Constitutional: Positive for activity change and unexpected weight change.  Genitourinary: Positive for vaginal discharge.  All other systems reviewed and are negative.      Objective:   Physical Exam A&Ox4 Well groomed female in no distress Blood pressure 118/78, pulse 98, height '5\' 6"'$  (1.676 m), weight 221 lb (100.2 kg).  Body mass index is 35.67 kg/m.  Waist circ. 41 inches HRR  Thyroid normal on exam Pelvic exam: normal external genitalia, vulva, vagina, cervix, uterus and adnexa, IUD string noted, increased yellow cervical discharge noted- swab obtained for BV.    Assessment:     BV Obesity IUD check    Plan:     Will keep IUD for now and try Clindesse gel to treat. Discussed flagyl and it not treating all types of BV well. NuSwab VG+ sent- will follow up accordingly Restarted on weight loss program with ER bontril. B12 injection given, labs obtained to rule out other etiology for unexplained weight gain. Reminded to increase exercise, especially in leu of sitting all day at work. Also to increase water intake RTC in 4 weeks for weight and BP and B12 with Amy.  Vanessia Bokhari,CNM

## 2018-04-14 NOTE — Telephone Encounter (Signed)
The patient is asking if the phendimetrazine tartrate 105 mg CP24 can be resent to [facility] today as a 35 mg tablet that she can take t.i.d. Instead of the above b/c the 35mg  tab is cheaper and she will be able to pick it up from [facility], please advise, thanks.

## 2018-04-15 LAB — HEMOGLOBIN A1C
Est. average glucose Bld gHb Est-mCnc: 111 mg/dL
HEMOGLOBIN A1C: 5.5 % (ref 4.8–5.6)

## 2018-04-15 LAB — COMPREHENSIVE METABOLIC PANEL
A/G RATIO: 1.5 (ref 1.2–2.2)
ALBUMIN: 4.1 g/dL (ref 3.5–5.5)
ALT: 18 IU/L (ref 0–32)
AST: 14 IU/L (ref 0–40)
Alkaline Phosphatase: 75 IU/L (ref 39–117)
BUN / CREAT RATIO: 12 (ref 9–23)
BUN: 11 mg/dL (ref 6–24)
Bilirubin Total: <0.2 mg/dL (ref 0.0–1.2)
CALCIUM: 9.4 mg/dL (ref 8.7–10.2)
CO2: 24 mmol/L (ref 20–29)
CREATININE: 0.93 mg/dL (ref 0.57–1.00)
Chloride: 102 mmol/L (ref 96–106)
GFR, EST AFRICAN AMERICAN: 88 mL/min/{1.73_m2} (ref >59)
GFR, EST NON AFRICAN AMERICAN: 76 mL/min/{1.73_m2} (ref >59)
GLOBULIN, TOTAL: 2.7 g/dL (ref 1.5–4.5)
Glucose: 90 mg/dL (ref 65–99)
POTASSIUM: 4.6 mmol/L (ref 3.5–5.2)
SODIUM: 141 mmol/L (ref 134–144)
Total Protein: 6.8 g/dL (ref 6.0–8.5)

## 2018-04-15 LAB — THYROID PANEL WITH TSH
Free Thyroxine Index: 1.7 (ref 1.2–4.9)
T3 UPTAKE RATIO: 22 % — AB (ref 24–39)
T4, Total: 7.9 ug/dL (ref 4.5–12.0)
TSH: 1.98 u[IU]/mL (ref 0.450–4.500)

## 2018-04-15 LAB — VITAMIN D 25 HYDROXY (VIT D DEFICIENCY, FRACTURES): Vit D, 25-Hydroxy: 24.9 ng/mL — ABNORMAL LOW (ref 30.0–100.0)

## 2018-04-17 LAB — NUSWAB VAGINITIS PLUS (VG+)
CHLAMYDIA TRACHOMATIS, NAA: NEGATIVE
Candida albicans, NAA: NEGATIVE
Candida glabrata, NAA: NEGATIVE
NEISSERIA GONORRHOEAE, NAA: NEGATIVE
TRICH VAG BY NAA: NEGATIVE

## 2018-04-17 NOTE — Telephone Encounter (Signed)
Advised pt on my chart about rx, she voiced understanding

## 2018-04-25 ENCOUNTER — Encounter: Payer: Self-pay | Admitting: *Deleted

## 2018-04-25 ENCOUNTER — Telehealth: Payer: Self-pay | Admitting: Obstetrics and Gynecology

## 2018-04-25 NOTE — Telephone Encounter (Signed)
The patient called and stated that her medication is not covered by her insurance and she would like to have a call back from Amy to discuss what needs to be done/prior authorization. The patient was a little upset dues to her dealing with this issue since last Friday. Please advise.

## 2018-04-25 NOTE — Telephone Encounter (Signed)
Sent pt mychart message

## 2018-04-26 ENCOUNTER — Other Ambulatory Visit: Payer: Self-pay | Admitting: *Deleted

## 2018-04-26 MED ORDER — PHENTERMINE HCL 37.5 MG PO CAPS: 37.5000 mg | ORAL_CAPSULE | ORAL | 0 refills | Status: DC

## 2018-05-11 ENCOUNTER — Telehealth: Payer: Self-pay | Admitting: Obstetrics and Gynecology

## 2018-05-11 NOTE — Telephone Encounter (Signed)
The patient called and stated that she would like to speak with a nurse in regards to her wanting to get her IUD removed tomorrow due to he only working a half day, The patient stated that she does not have a preference of who see's her and would like to see anybody. Patient is waiting on a call back to confirm if she can seen tomorrow or not. No other information was disclosed. Pleas advise.

## 2018-05-11 NOTE — Telephone Encounter (Signed)
appt was made for IUD removal

## 2018-05-12 ENCOUNTER — Ambulatory Visit (INDEPENDENT_AMBULATORY_CARE_PROVIDER_SITE_OTHER): Payer: Medicaid Other | Admitting: Certified Nurse Midwife

## 2018-05-12 ENCOUNTER — Encounter: Payer: Self-pay | Admitting: Certified Nurse Midwife

## 2018-05-12 VITALS — BP 122/90 | HR 112 | Ht 67.0 in | Wt 209.6 lb

## 2018-05-12 DIAGNOSIS — Z30432 Encounter for removal of intrauterine contraceptive device: Secondary | ICD-10-CM | POA: Diagnosis not present

## 2018-05-12 DIAGNOSIS — Z3009 Encounter for other general counseling and advice on contraception: Secondary | ICD-10-CM

## 2018-05-12 MED ORDER — VITAMIN D (ERGOCALCIFEROL) 1.25 MG (50000 UNIT) PO CAPS: 50000.0000 [IU] | ORAL_CAPSULE | ORAL | 1 refills | Status: DC

## 2018-05-12 NOTE — Progress Notes (Signed)
Briana Cabrera is a 42 y.o. year old G42P1001 Caucasian female who presents for removal of a Mirena IUD. Her Mirena IUD was placed 07/13/2017.   BP 122/90   Pulse (!) 112   Ht 5\' 7"  (1.702 m)   Wt 209 lb 9.6 oz (95.1 kg)   BMI 32.83 kg/m   Time out was performed.  A small plastic speculum was placed in the vagina.  The cervix was visualized, and the strings were visible. They were grasped and the Mirena was easily removed intact without complications.   Rx: Lo loestrin and Vitamin D, see orders.   Reviewed red flag symptoms and when to call.   RTC as needed.    Diona Fanti, CNM [facility], [facility]

## 2018-05-12 NOTE — Patient Instructions (Addendum)
Preventive Care 40-64 Years, Female Preventive care refers to lifestyle choices and visits with your health care provider that can promote health and wellness. What does preventive care include?  A yearly physical exam. This is also called an annual well check.  Dental exams once or twice a year.  Routine eye exams. Ask your health care provider how often you should have your eyes checked.  Personal lifestyle choices, including: ? Daily care of your teeth and gums. ? Regular physical activity. ? Eating a healthy diet. ? Avoiding tobacco and drug use. ? Limiting alcohol use. ? Practicing safe sex. ? Taking low-dose aspirin daily starting at age 42. ? Taking vitamin and mineral supplements as recommended by your health care provider. What happens during an annual well check? The services and screenings done by your health care provider during your annual well check will depend on your age, overall health, lifestyle risk factors, and family history of disease. Counseling Your health care provider may ask you questions about your:  Alcohol use.  Tobacco use.  Drug use.  Emotional well-being.  Home and relationship well-being.  Sexual activity.  Eating habits.  Work and work Statistician.  Method of birth control.  Menstrual cycle.  Pregnancy history.  Screening You may have the following tests or measurements:  Height, weight, and BMI.  Blood pressure.  Lipid and cholesterol levels. These may be checked every 5 years, or more frequently if you are over 42 years old.  Skin check.  Lung cancer screening. You may have this screening every year starting at age 42 if you have a 30-pack-year history of smoking and currently smoke or have quit within the past 15 years.  Fecal occult blood test (FOBT) of the stool. You may have this test every year starting at age 42.  Flexible sigmoidoscopy or colonoscopy. You may have a sigmoidoscopy every 5 years or a colonoscopy  every 10 years starting at age 42.  Hepatitis C blood test.  Hepatitis B blood test.  Sexually transmitted disease (STD) testing.  Diabetes screening. This is done by checking your blood sugar (glucose) after you have not eaten for a while (fasting). You may have this done every 1-3 years.  Mammogram. This may be done every 1-2 years. Talk to your health care provider about when you should start having regular mammograms. This may depend on whether you have a family history of breast cancer.  BRCA-related cancer screening. This may be done if you have a family history of breast, ovarian, tubal, or peritoneal cancers.  Pelvic exam and Pap test. This may be done every 3 years starting at age 42. Starting at age 42, this may be done every 5 years if you have a Pap test in combination with an HPV test.  Bone density scan. This is done to screen for osteoporosis. You may have this scan if you are at high risk for osteoporosis.  Discuss your test results, treatment options, and if necessary, the need for more tests with your health care provider. Vaccines Your health care provider may recommend certain vaccines, such as:  Influenza vaccine. This is recommended every year.  Tetanus, diphtheria, and acellular pertussis (Tdap, Td) vaccine. You may need a Td booster every 10 years.  Varicella vaccine. You may need this if you have not been vaccinated.  Zoster vaccine. You may need this after age 42.  Measles, mumps, and rubella (MMR) vaccine. You may need at least one dose of MMR if you were born in  1957 or later. You may also need a second dose.  Pneumococcal 13-valent conjugate (PCV13) vaccine. You may need this if you have certain conditions and were not previously vaccinated.  Pneumococcal polysaccharide (PPSV23) vaccine. You may need one or two doses if you smoke cigarettes or if you have certain conditions.  Meningococcal vaccine. You may need this if you have certain  conditions.  Hepatitis A vaccine. You may need this if you have certain conditions or if you travel or work in places where you may be exposed to hepatitis A.  Hepatitis B vaccine. You may need this if you have certain conditions or if you travel or work in places where you may be exposed to hepatitis B.  Haemophilus influenzae type b (Hib) vaccine. You may need this if you have certain conditions.  Talk to your health care provider about which screenings and vaccines you need and how often you need them. This information is not intended to replace advice given to you by your health care provider. Make sure you discuss any questions you have with your health care provider. Document Released: 08/08/2015 Document Revised: 03/31/2016 Document Reviewed: 05/13/2015 Elsevier Interactive Patient Education  2018 [place]. Ethinyl Estradiol; Norethindrone Acetate; Ferrous fumarate tablets or capsules What is this medicine? ETHINYL ESTRADIOL; NORETHINDRONE ACETATE; FERROUS FUMARATE (ETH in il es tra DYE ole; nor eth IN drone AS e tate; FER [place] FUE ma rate) is an oral contraceptive. The products combine two types of female hormones, an estrogen and a progestin. They are used to prevent ovulation and pregnancy. Some products are also used to treat acne in females. This medicine may be used for other purposes; ask your health care provider or pharmacist if you have questions. COMMON BRAND NAME(S): Blisovi [address], Blisovi Fe, Estrostep Fe, Gildess [address] Gildess Fe 1.5/30, Gildess Fe 1/20, Junel Fe 1.5/30, Junel Fe 1/20, Junel Fe 24, Larin Fe, Lo Loestrin Fe, Loestrin 24 Fe, Loestrin FE 1.5/30, Loestrin FE 1/20, Lomedia 24 Fe, Microgestin 24 Fe, Microgestin Fe 1.5/30, Microgestin Fe 1/20, Tarina Fe 1/20, Taytulla, Tilia Fe, Tri-Legest Fe What should I tell my health care provider before I take this medicine? They need to know if you have any of these conditions: -abnormal vaginal bleeding -blood vessel  disease -breast, cervical, endometrial, ovarian, liver, or uterine cancer -diabetes -gallbladder disease -heart disease or recent heart attack -high blood pressure -high cholesterol -history of blood clots -kidney disease -liver disease -migraine headaches -smoke tobacco -stroke -systemic lupus erythematosus (SLE) -an unusual or allergic reaction to estrogens, progestins, other medicines, foods, dyes, or preservatives -pregnant or trying to get pregnant -breast-feeding How should I use this medicine? Take this medicine by mouth. To reduce nausea, this medicine may be taken with food. Follow the directions on the prescription label. Take this medicine at the same time each day and in the order directed on the package. Do not take your medicine more often than directed. A patient package insert for the product will be given with each prescription and refill. Read this sheet carefully each time. The sheet may change frequently. Contact your pediatrician regarding the use of this medicine in children. Special care may be needed. This medicine has been used in female children who have started having menstrual periods. Overdosage: If you think you have taken too much of this medicine contact a poison control center or emergency room at once. NOTE: This medicine is only for you. Do not share this medicine with others. What if I miss  a dose? If you miss a dose, refer to the patient information sheet you received with your medicine for direction. If you miss more than one pill, this medicine may not be as effective and you may need to use another form of birth control. What may interact with this medicine? Do not take this medicine with the following medication: -dasabuvir; ombitasvir; paritaprevir; ritonavir -ombitasvir; paritaprevir; ritonavir This medicine may also interact with the following medications: -acetaminophen -antibiotics or medicines for infections, especially rifampin,  rifabutin, rifapentine, and griseofulvin, and possibly penicillins or tetracyclines -aprepitant -ascorbic acid (vitamin C) -atorvastatin -barbiturate medicines, such as phenobarbital -bosentan -carbamazepine -caffeine -clofibrate -cyclosporine -dantrolene -doxercalciferol -felbamate -grapefruit juice -hydrocortisone -medicines for anxiety or sleeping problems, such as diazepam or temazepam -medicines for diabetes, including pioglitazone -mineral oil -modafinil -mycophenolate -nefazodone -oxcarbazepine -phenytoin -prednisolone -ritonavir or other medicines for HIV infection or AIDS -rosuvastatin -selegiline -soy isoflavones supplements -St. John's wort -tamoxifen or raloxifene -theophylline -thyroid hormones -topiramate -warfarin This list may not describe all possible interactions. Give your health care provider a list of all the medicines, herbs, non-prescription drugs, or dietary supplements you use. Also tell them if you smoke, drink alcohol, or use illegal drugs. Some items may interact with your medicine. What should I watch for while using this medicine? Visit your doctor or health care professional for regular checks on your progress. You will need a regular breast and pelvic exam and Pap smear while on this medicine. Use an additional method of contraception during the first cycle that you take these tablets. If you have any reason to think you are pregnant, stop taking this medicine right away and contact your doctor or health care professional. If you are taking this medicine for hormone related problems, it may take several cycles of use to see improvement in your condition. Smoking increases the risk of getting a blood clot or having a stroke while you are taking birth control pills, especially if you are more than 42 years old. You are strongly advised not to smoke. This medicine can make your body retain fluid, making your fingers, hands, or ankles swell. Your  blood pressure can go up. Contact your doctor or health care professional if you feel you are retaining fluid. This medicine can make you more sensitive to the sun. Keep out of the sun. If you cannot avoid being in the sun, wear protective clothing and use sunscreen. Do not use sun lamps or tanning beds/booths. If you wear contact lenses and notice visual changes, or if the lenses begin to feel uncomfortable, consult your eye care specialist. In some women, tenderness, swelling, or minor bleeding of the gums may occur. Notify your dentist if this happens. Brushing and flossing your teeth regularly may help limit this. See your dentist regularly and inform your dentist of the medicines you are taking. If you are going to have elective surgery, you may need to stop taking this medicine before the surgery. Consult your health care professional for advice. This medicine does not protect you against HIV infection (AIDS) or any other sexually transmitted diseases. What side effects may I notice from receiving this medicine? Side effects that you should report to your doctor or health care professional as soon as possible: -allergic reactions like skin rash, itching or hives, swelling of the face, lips, or tongue -breast tissue changes or discharge -changes in vaginal bleeding during your period or between your periods -changes in vision -chest pain -confusion -coughing up blood -dizziness -feeling faint or lightheaded -headaches  or migraines -leg, arm or groin pain -loss of balance or coordination -severe or sudden headaches -stomach pain (severe) -sudden shortness of breath -sudden numbness or weakness of the face, arm or leg -symptoms of vaginal infection like itching, irritation or unusual discharge -tenderness in the upper abdomen -trouble speaking or understanding -vomiting -yellowing of the eyes or skin Side effects that usually do not require medical attention (report to your doctor or  health care professional if they continue or are bothersome): -breakthrough bleeding and spotting that continues beyond the 3 initial cycles of pills -breast tenderness -mood changes, anxiety, depression, frustration, anger, or emotional outbursts -increased sensitivity to sun or ultraviolet light -nausea -skin rash, acne, or brown spots on the skin -weight gain (slight) This list may not describe all possible side effects. Call your doctor for medical advice about side effects. You may report side effects to FDA at 1-800-FDA-1088. Where should I keep my medicine? Keep out of the reach of children. Store at room temperature between 15 and 30 degrees C (59 and 86 degrees F). Throw away any unused medicine after the expiration date. NOTE: This sheet is a summary. It may not cover all possible information. If you have questions about this medicine, talk to your doctor, pharmacist, or health care provider.  2018 Elsevier/Gold Standard (2016-03-22 08:04:41)

## 2018-05-19 ENCOUNTER — Encounter: Payer: Medicaid Other | Admitting: Obstetrics and Gynecology

## 2018-06-02 ENCOUNTER — Telehealth: Payer: Self-pay | Admitting: Obstetrics and Gynecology

## 2018-06-02 NOTE — Telephone Encounter (Signed)
Patient called requesting a refill on phentermine.Thanks

## 2018-06-05 ENCOUNTER — Other Ambulatory Visit: Payer: Self-pay | Admitting: Obstetrics and Gynecology

## 2018-06-06 NOTE — Telephone Encounter (Signed)
Sent pt mychart message

## 2018-06-07 ENCOUNTER — Other Ambulatory Visit: Payer: Self-pay | Admitting: Obstetrics and Gynecology

## 2018-06-08 ENCOUNTER — Other Ambulatory Visit: Payer: Self-pay

## 2018-06-08 ENCOUNTER — Encounter: Payer: Self-pay | Admitting: Certified Nurse Midwife

## 2018-06-08 NOTE — Telephone Encounter (Signed)
See MyChart message from today, patient advised to schedule an appointment to discuss restarting Phentermine.

## 2018-06-14 ENCOUNTER — Other Ambulatory Visit: Payer: Self-pay | Admitting: *Deleted

## 2018-06-14 MED ORDER — PHENDIMETRAZINE TARTRATE ER 105 MG PO CP24: 1.0000 | ORAL_CAPSULE | Freq: Every day | ORAL | 0 refills | Status: DC

## 2018-07-05 ENCOUNTER — Encounter: Payer: Self-pay | Admitting: Certified Nurse Midwife

## 2018-07-06 ENCOUNTER — Other Ambulatory Visit: Payer: Self-pay

## 2018-07-06 MED ORDER — ETONOGESTREL-ETHINYL ESTRADIOL 0.12-0.015 MG/24HR VA RING: VAGINAL_RING | VAGINAL | 12 refills | Status: DC

## 2018-07-17 ENCOUNTER — Ambulatory Visit: Payer: Self-pay | Admitting: Podiatry

## 2018-07-28 ENCOUNTER — Ambulatory Visit (INDEPENDENT_AMBULATORY_CARE_PROVIDER_SITE_OTHER): Payer: Medicaid Other

## 2018-07-28 ENCOUNTER — Other Ambulatory Visit: Payer: Self-pay | Admitting: Podiatry

## 2018-07-28 ENCOUNTER — Ambulatory Visit: Payer: Medicaid Other | Admitting: Podiatry

## 2018-07-28 ENCOUNTER — Encounter: Payer: Self-pay | Admitting: Podiatry

## 2018-07-28 DIAGNOSIS — M722 Plantar fascial fibromatosis: Secondary | ICD-10-CM

## 2018-07-28 DIAGNOSIS — M7671 Peroneal tendinitis, right leg: Secondary | ICD-10-CM

## 2018-07-28 DIAGNOSIS — M7672 Peroneal tendinitis, left leg: Secondary | ICD-10-CM

## 2018-07-28 MED ORDER — METHYLPREDNISOLONE 4 MG PO TBPK: ORAL_TABLET | ORAL | 0 refills | Status: DC

## 2018-07-28 MED ORDER — MELOXICAM 15 MG PO TABS: 15.0000 mg | ORAL_TABLET | Freq: Every day | ORAL | 1 refills | Status: AC

## 2018-08-01 NOTE — Progress Notes (Signed)
   Subjective: 43 year old female presenting today as a new patient with a chief complaint of intermittent sharp, aching pain of the bilateral heels that began about 6 months ago. She reports associated pain to the lateral aspects of the feet as well. She states the pain is worse in the morning and after being on her feet for long periods of time. Walking barefoot also increases the pain. She has been using gel heel pads for treatment with some relief. Patient is here for further evaluation and treatment.    Past Medical History:  Diagnosis Date  . Cancer ([place])    skin  . GERD (gastroesophageal reflux disease)      Objective: Physical Exam General: The patient is alert and oriented x3 in no acute distress.  Dermatology: Skin is warm, dry and supple bilateral lower extremities. Negative for open lesions or macerations bilateral.   Vascular: Dorsalis Pedis and Posterior Tibial pulses palpable bilateral.  Capillary fill time is immediate to all digits.  Neurological: Epicritic and protective threshold intact bilateral.   Musculoskeletal: Tenderness to palpation to the plantar aspect of the bilateral heels along the plantar fascia as well as to the insertion of the peroneal tendon of the bilateral feet. All other joints range of motion within normal limits bilateral. Strength 5/5 in all groups bilateral.   Radiographic exam: Normal osseous mineralization. Joint spaces preserved. No fracture/dislocation/boney destruction. No other soft tissue abnormalities or radiopaque foreign bodies.   Assessment: 1. plantar fasciitis bilateral feet 2. Insertional peroneal tendinitis bilateral  Plan of Care:  1. Patient evaluated. Xrays reviewed.   2. Prescription for Medrol Dose Pak provided to patient. 3. Prescription for Meloxicam provided to patient. 4. Plantar fascial braces dispensed bilaterally.  5. Return to clinic in 6 weeks.    Edrick Kins, DPM [facility]  Dr. Edrick Kins, DPM    [address], [place] [address]                Office [phone]  Fax [phone]

## 2018-09-08 ENCOUNTER — Encounter: Payer: Medicaid Other | Admitting: Podiatry

## 2018-09-20 NOTE — Progress Notes (Signed)
This encounter was created in error - please disregard.

## 2018-09-29 ENCOUNTER — Telehealth: Payer: Self-pay | Admitting: Obstetrics and Gynecology

## 2018-09-29 ENCOUNTER — Ambulatory Visit: Payer: Medicaid Other | Attending: Internal Medicine

## 2018-10-06 NOTE — Telephone Encounter (Signed)
ERROR

## 2019-03-02 ENCOUNTER — Other Ambulatory Visit: Payer: Self-pay | Admitting: Internal Medicine

## 2019-03-02 DIAGNOSIS — Z1231 Encounter for screening mammogram for malignant neoplasm of breast: Secondary | ICD-10-CM

## 2019-03-06 ENCOUNTER — Telehealth: Payer: Self-pay | Admitting: Obstetrics and Gynecology

## 2019-03-06 NOTE — Telephone Encounter (Signed)
Pt called and requested a refill of the prescription Flagyl or "Metro-gel" The patient is requesting a call back if possible. Please advise.

## 2019-03-07 ENCOUNTER — Other Ambulatory Visit: Payer: Self-pay | Admitting: *Deleted

## 2019-03-07 MED ORDER — METRONIDAZOLE 0.75 % VA GEL: 1.0000 | Freq: Two times a day (BID) | VAGINAL | 2 refills | Status: DC

## 2019-03-07 NOTE — Telephone Encounter (Signed)
Sent mcm-ac

## 2019-03-23 ENCOUNTER — Encounter: Payer: Medicaid Other | Admitting: Obstetrics and Gynecology

## 2019-04-11 ENCOUNTER — Telehealth: Payer: Self-pay | Admitting: Obstetrics and Gynecology

## 2019-04-11 ENCOUNTER — Encounter: Payer: Self-pay | Admitting: *Deleted

## 2019-04-11 NOTE — Telephone Encounter (Signed)
Sent mcm

## 2019-04-11 NOTE — Telephone Encounter (Signed)
The patient called and stated that she is wanting to know if she is able to have a script of [place] [facility] for her to try/ or for her to receive a sample. Please advise.

## 2019-04-12 ENCOUNTER — Encounter: Payer: Medicaid Other | Admitting: Obstetrics and Gynecology

## 2019-04-13 ENCOUNTER — Telehealth: Payer: Self-pay | Admitting: Obstetrics and Gynecology

## 2019-04-13 NOTE — Telephone Encounter (Signed)
Pt called to check status of LoLo estrogen sample. I informed pt that her nurse sent her a MyChart message to leave sample in front office. Pt stated she would like a sample left up front. Please advise.

## 2019-04-13 NOTE — Telephone Encounter (Signed)
Done-ac 

## 2019-04-26 ENCOUNTER — Other Ambulatory Visit: Payer: Self-pay | Admitting: *Deleted

## 2019-04-26 ENCOUNTER — Telehealth: Payer: Self-pay | Admitting: Obstetrics and Gynecology

## 2019-04-26 MED ORDER — NORETHIN-ETH ESTRAD-FE BIPHAS 1 MG-10 MCG / 10 MCG PO TABS: 1.0000 | ORAL_TABLET | Freq: Every day | ORAL | 2 refills | Status: DC

## 2019-04-26 NOTE — Telephone Encounter (Signed)
The patient called and stated that she has a sample of (Lo-Lo Estrin) and wants to have that birth control sent into her pharmacy. Pt did not disclose any other information. Please advise.

## 2019-04-26 NOTE — Telephone Encounter (Signed)
Medication sent into pharmacy  

## 2019-04-27 ENCOUNTER — Encounter: Payer: Medicaid Other | Admitting: Obstetrics and Gynecology

## 2019-06-01 ENCOUNTER — Encounter: Payer: Medicaid Other | Admitting: Obstetrics and Gynecology

## 2019-06-11 ENCOUNTER — Other Ambulatory Visit: Payer: Self-pay | Admitting: Certified Nurse Midwife

## 2019-06-15 ENCOUNTER — Other Ambulatory Visit: Payer: Self-pay | Admitting: Certified Nurse Midwife

## 2019-06-25 ENCOUNTER — Other Ambulatory Visit: Payer: Self-pay | Admitting: Certified Nurse Midwife

## 2019-06-25 ENCOUNTER — Telehealth: Payer: Self-pay | Admitting: Certified Nurse Midwife

## 2019-06-25 NOTE — Telephone Encounter (Signed)
The patient called and stated that she needs a refill of her vitamin D sent to her pharmacy. Pt is requesting the refill to go to [facility]. Please advise.

## 2019-06-29 ENCOUNTER — Other Ambulatory Visit: Payer: Self-pay

## 2019-06-29 MED ORDER — ERGOCALCIFEROL 1.25 MG (50000 UT) PO CAPS: 50000.0000 [IU] | ORAL_CAPSULE | ORAL | 5 refills | Status: DC

## 2019-06-29 NOTE — Telephone Encounter (Signed)
Per patient request- Vit D sent to [facility].

## 2019-07-18 ENCOUNTER — Encounter: Payer: Medicaid Other | Admitting: Certified Nurse Midwife

## 2019-08-28 ENCOUNTER — Encounter: Payer: Medicaid Other | Admitting: Certified Nurse Midwife

## 2019-10-19 ENCOUNTER — Other Ambulatory Visit (HOSPITAL_COMMUNITY)
Admission: RE | Admit: 2019-10-19 | Discharge: 2019-10-19 | Disposition: A | Discharge status: Home / Self Care | Payer: Medicaid Other | Source: Ambulatory Visit | Attending: Certified Nurse Midwife | Admitting: Certified Nurse Midwife

## 2019-10-19 ENCOUNTER — Other Ambulatory Visit: Payer: Self-pay

## 2019-10-19 ENCOUNTER — Ambulatory Visit (INDEPENDENT_AMBULATORY_CARE_PROVIDER_SITE_OTHER): Payer: Medicaid Other | Admitting: Certified Nurse Midwife

## 2019-10-19 ENCOUNTER — Encounter: Payer: Self-pay | Admitting: Certified Nurse Midwife

## 2019-10-19 VITALS — BP 121/80 | HR 107 | Ht 67.0 in | Wt 230.2 lb

## 2019-10-19 DIAGNOSIS — Z01419 Encounter for gynecological examination (general) (routine) without abnormal findings: Secondary | ICD-10-CM | POA: Insufficient documentation

## 2019-10-19 DIAGNOSIS — Z6836 Body mass index (BMI) 36.0-36.9, adult: Secondary | ICD-10-CM

## 2019-10-19 DIAGNOSIS — Z124 Encounter for screening for malignant neoplasm of cervix: Secondary | ICD-10-CM | POA: Insufficient documentation

## 2019-10-19 DIAGNOSIS — Z1231 Encounter for screening mammogram for malignant neoplasm of breast: Secondary | ICD-10-CM | POA: Diagnosis not present

## 2019-10-19 DIAGNOSIS — Z0000 Encounter for general adult medical examination without abnormal findings: Secondary | ICD-10-CM

## 2019-10-19 MED ORDER — NORETHIN-ETH ESTRAD-FE BIPHAS 1 MG-10 MCG / 10 MCG PO TABS: 1.0000 | ORAL_TABLET | Freq: Every day | ORAL | 3 refills | Status: DC

## 2019-10-19 NOTE — Patient Instructions (Addendum)
Preventive Care 40-44 Years Old, Female Preventive care refers to visits with your health care provider and lifestyle choices that can promote health and wellness. This includes:  A yearly physical exam. This may also be called an annual well check.  Regular dental visits and eye exams.  Immunizations.  Screening for certain conditions.  Healthy lifestyle choices, such as eating a healthy diet, getting regular exercise, not using drugs or products that contain nicotine and tobacco, and limiting alcohol use. What can I expect for my preventive care visit? Physical exam Your health care provider will check your:  Height and weight. This may be used to calculate body mass index (BMI), which tells if you are at a healthy weight.  Heart rate and blood pressure.  Skin for abnormal spots. Counseling Your health care provider may ask you questions about your:  Alcohol, tobacco, and drug use.  Emotional well-being.  Home and relationship well-being.  Sexual activity.  Eating habits.  Work and work environment.  Method of birth control.  Menstrual cycle.  Pregnancy history. What immunizations do I need?  Influenza (flu) vaccine  This is recommended every year. Tetanus, diphtheria, and pertussis (Tdap) vaccine  You may need a Td booster every 10 years. Varicella (chickenpox) vaccine  You may need this if you have not been vaccinated. Zoster (shingles) vaccine  You may need this after age 60. Measles, mumps, and rubella (MMR) vaccine  You may need at least one dose of MMR if you were born in 1957 or later. You may also need a second dose. Pneumococcal conjugate (PCV13) vaccine  You may need this if you have certain conditions and were not previously vaccinated. Pneumococcal polysaccharide (PPSV23) vaccine  You may need one or two doses if you smoke cigarettes or if you have certain conditions. Meningococcal conjugate (MenACWY) vaccine  You may need this if you  have certain conditions. Hepatitis A vaccine  You may need this if you have certain conditions or if you travel or work in places where you may be exposed to hepatitis A. Hepatitis B vaccine  You may need this if you have certain conditions or if you travel or work in places where you may be exposed to hepatitis B. Haemophilus influenzae type b (Hib) vaccine  You may need this if you have certain conditions. Human papillomavirus (HPV) vaccine  If recommended by your health care provider, you may need three doses over 6 months. You may receive vaccines as individual doses or as more than one vaccine together in one shot (combination vaccines). Talk with your health care provider about the risks and benefits of combination vaccines. What tests do I need? Blood tests  Lipid and cholesterol levels. These may be checked every 5 years, or more frequently if you are over 50 years old.  Hepatitis C test.  Hepatitis B test. Screening  Lung cancer screening. You may have this screening every year starting at age 55 if you have a 30-pack-year history of smoking and currently smoke or have quit within the past 15 years.  Colorectal cancer screening. All adults should have this screening starting at age 50 and continuing until age 75. Your health care provider may recommend screening at age 45 if you are at increased risk. You will have tests every 1-10 years, depending on your results and the type of screening test.  Diabetes screening. This is done by checking your blood sugar (glucose) after you have not eaten for a while (fasting). You may have this   done every 1-3 years.  Mammogram. This may be done every 1-2 years. Talk with your health care provider about when you should start having regular mammograms. This may depend on whether you have a family history of breast cancer.  BRCA-related cancer screening. This may be done if you have a family history of breast, ovarian, tubal, or peritoneal  cancers.  Pelvic exam and Pap test. This may be done every 3 years starting at age 90. Starting at age 70, this may be done every 5 years if you have a Pap test in combination with an HPV test. Other tests  Sexually transmitted disease (STD) testing.  Bone density scan. This is done to screen for osteoporosis. You may have this scan if you are at high risk for osteoporosis. Follow these instructions at home: Eating and drinking  Eat a diet that includes fresh fruits and vegetables, whole grains, lean protein, and low-fat dairy.  Take vitamin and mineral supplements as recommended by your health care provider.  Do not drink alcohol if: ? Your health care provider tells you not to drink. ? You are pregnant, may be pregnant, or are planning to become pregnant.  If you drink alcohol: ? Limit how much you have to 0-1 drink a day. ? Be aware of how much alcohol is in your drink. In the [place] one drink equals one 12 oz bottle of beer (355 mL), one 5 oz glass of wine (148 mL), or one 1 oz glass of hard liquor (44 mL). Lifestyle  Take daily care of your teeth and gums.  Stay active. Exercise for at least 30 minutes on 5 or more days each week.  Do not use any products that contain nicotine or tobacco, such as cigarettes, e-cigarettes, and chewing tobacco. If you need help quitting, ask your health care provider.  If you are sexually active, practice safe sex. Use a condom or other form of birth control (contraception) in order to prevent pregnancy and STIs (sexually transmitted infections).  If told by your health care provider, take low-dose aspirin daily starting at age 49. What's next?  Visit your health care provider once a year for a well check visit.  Ask your health care provider how often you should have your eyes and teeth checked.  Stay up to date on all vaccines. This information is not intended to replace advice given to you by your health care provider. Make sure you  discuss any questions you have with your health care provider. Document Revised: 03/23/2018 Document Reviewed: 03/23/2018 Elsevier Patient Education  [place]. Ethinyl Estradiol; Norethindrone Acetate; Ferrous fumarate tablets or capsules What is this medicine? ETHINYL ESTRADIOL; NORETHINDRONE ACETATE; FERROUS FUMARATE (ETH in il es tra DYE ole; nor eth IN drone AS e tate; FER [place] FUE ma rate) is an oral contraceptive. The products combine two types of female hormones, an estrogen and a progestin. They are used to prevent ovulation and pregnancy. Some products are also used to treat acne in females. This medicine may be used for other purposes; ask your health care provider or pharmacist if you have questions. COMMON BRAND NAME(S): Aurovela [address] Fe 1/20, Aurovela Fe, [place] [address], [address] Fe, [place] Fe, Gildess 24 Fe, Gildess Fe 1.5/30, Gildess Fe 1/20, Hailey 24 Fe, Hailey Fe 1.5/30, Junel Fe 1.5/30, Junel Fe 1/20, Junel Fe 24, [place]in Fe, Lo Loestrin Fe, Loestrin 24 Fe, Loestrin FE 1.5/30, Loestrin FE 1/20, Lomedia 24 Fe, Microgestin 24 Fe, Microgestin Fe 1.5/30, Microgestin Fe 1/20,  Tarina 24 Fe, Tarina Fe 1/20, [place], Tilia Fe, Tri-Legest Fe What should I tell my health care provider before I take this medicine? They need to know if you have any of these conditions:  abnormal vaginal bleeding  blood vessel disease  breast, cervical, endometrial, ovarian, liver, or uterine cancer  diabetes  gallbladder disease  heart disease or recent heart attack  high blood pressure  high cholesterol  history of blood clots  kidney disease  liver disease  migraine headaches  smoke tobacco  stroke  systemic lupus erythematosus (SLE)  an unusual or allergic reaction to estrogens, progestins, other medicines, foods, dyes, or preservatives  pregnant or trying to get pregnant  breast-feeding How should I use this medicine? Take this medicine by mouth. To reduce nausea, this  medicine may be taken with food. Follow the directions on the prescription label. Take this medicine at the same time each day and in the order directed on the package. Do not take your medicine more often than directed. A patient package insert for the product will be given with each prescription and refill. Read this sheet carefully each time. The sheet may change frequently. Contact your pediatrician regarding the use of this medicine in children. Special care may be needed. This medicine has been used in female children who have started having menstrual periods. Overdosage: If you think you have taken too much of this medicine contact a poison control center or emergency room at once. NOTE: This medicine is only for you. Do not share this medicine with others. What if I miss a dose? If you miss a dose, refer to the patient information sheet you received with your medicine for direction. If you miss more than one pill, this medicine may not be as effective and you may need to use another form of birth control. What may interact with this medicine? Do not take this medicine with the following medication:  dasabuvir; ombitasvir; paritaprevir; ritonavir  ombitasvir; paritaprevir; ritonavir This medicine may also interact with the following medications:  acetaminophen  antibiotics or medicines for infections, especially rifampin, rifabutin, rifapentine, and griseofulvin, and possibly penicillins or tetracyclines  aprepitant  ascorbic acid (vitamin C)  atorvastatin  barbiturate medicines, such as phenobarbital  bosentan  carbamazepine  caffeine  clofibrate  cyclosporine  dantrolene  doxercalciferol  felbamate  grapefruit juice  hydrocortisone  medicines for anxiety or sleeping problems, such as diazepam or temazepam  medicines for diabetes, including pioglitazone  mineral  oil  modafinil  mycophenolate  nefazodone  oxcarbazepine  phenytoin  prednisolone  ritonavir or other medicines for HIV infection or AIDS  rosuvastatin  selegiline  soy isoflavones supplements  St. John's wort  tamoxifen or raloxifene  theophylline  thyroid hormones  topiramate  warfarin This list may not describe all possible interactions. Give your health care provider a list of all the medicines, herbs, non-prescription drugs, or dietary supplements you use. Also tell them if you smoke, drink alcohol, or use illegal drugs. Some items may interact with your medicine. What should I watch for while using this medicine? Visit your doctor or health care professional for regular checks on your progress. You will need a regular breast and pelvic exam and Pap smear while on this medicine. Use an additional method of contraception during the first cycle that you take these tablets. If you have any reason to think you are pregnant, stop taking this medicine right away and contact your doctor or health care professional. If you are taking this  medicine for hormone related problems, it may take several cycles of use to see improvement in your condition. Smoking increases the risk of getting a blood clot or having a stroke while you are taking birth control pills, especially if you are more than 44 years old. You are strongly advised not to smoke. This medicine can make your body retain fluid, making your fingers, hands, or ankles swell. Your blood pressure can go up. Contact your doctor or health care professional if you feel you are retaining fluid. This medicine can make you more sensitive to the sun. Keep out of the sun. If you cannot avoid being in the sun, wear protective clothing and use sunscreen. Do not use sun lamps or tanning beds/booths. If you wear contact lenses and notice visual changes, or if the lenses begin to feel uncomfortable, consult your eye care specialist. In  some women, tenderness, swelling, or minor bleeding of the gums may occur. Notify your dentist if this happens. Brushing and flossing your teeth regularly may help limit this. See your dentist regularly and inform your dentist of the medicines you are taking. If you are going to have elective surgery, you may need to stop taking this medicine before the surgery. Consult your health care professional for advice. This medicine does not protect you against HIV infection (AIDS) or any other sexually transmitted diseases. What side effects may I notice from receiving this medicine? Side effects that you should report to your doctor or health care professional as soon as possible:  allergic reactions like skin rash, itching or hives, swelling of the face, lips, or tongue  breast tissue changes or discharge  changes in vaginal bleeding during your period or between your periods  changes in vision  chest pain  confusion  coughing up blood  dizziness  feeling faint or lightheaded  headaches or migraines  leg, arm or groin pain  loss of balance or coordination  severe or sudden headaches  stomach pain (severe)  sudden shortness of breath  sudden numbness or weakness of the face, arm or leg  symptoms of vaginal infection like itching, irritation or unusual discharge  tenderness in the upper abdomen  trouble speaking or understanding  vomiting  yellowing of the eyes or skin Side effects that usually do not require medical attention (report to your doctor or health care professional if they continue or are bothersome):  breakthrough bleeding and spotting that continues beyond the 3 initial cycles of pills  breast tenderness  mood changes, anxiety, depression, frustration, anger, or emotional outbursts  increased sensitivity to sun or ultraviolet light  nausea  skin rash, acne, or brown spots on the skin  weight gain (slight) This list may not describe all possible  side effects. Call your doctor for medical advice about side effects. You may report side effects to FDA at 1-800-FDA-1088. Where should I keep my medicine? Keep out of the reach of children. Store at room temperature between 15 and 30 degrees C (59 and 86 degrees F). Throw away any unused medicine after the expiration date. NOTE: This sheet is a summary. It may not cover all possible information. If you have questions about this medicine, talk to your doctor, pharmacist, or health care provider.  2020 Elsevier/Gold Standard (2016-03-22 08:04:41)  Laparoscopic Tubal Ligation Laparoscopic tubal ligation is a procedure to close the fallopian tubes. This is done so that you cannot get pregnant. When the fallopian tubes are closed, the eggs that your ovaries release cannot enter the  uterus, and sperm cannot reach the released eggs. You should not have this procedure if you want to get pregnant someday or if you are unsure about having more children. Tell a health care provider about:  Any allergies you have.  All medicines you are taking, including vitamins, herbs, eye drops, creams, and over-the-counter medicines.  Any problems you or family members have had with anesthetic medicines.  Any blood disorders you have.  Any surgeries you have had.  Any medical conditions you have.  Whether you are pregnant or may be pregnant.  Any past pregnancies. What are the risks? Generally, this is a safe procedure. However, problems may occur, including:  Infection.  Bleeding.  Injury to other organs in the abdomen.  Side effects from anesthetic medicines.  Failure of the procedure. This procedure can increase your risk of a kind of pregnancy in which a fertilized egg attaches to the outside of the uterus (ectopic pregnancy). What happens before the procedure? Medicines  Ask your health care provider about: ? Changing or stopping your regular medicines. This is especially important if you  are taking diabetes medicines or blood thinners. ? Taking medicines such as aspirin and ibuprofen. These medicines can thin your blood. Do not take these medicines unless your health care provider tells you to take them. ? Taking over-the-counter medicines, vitamins, herbs, and supplements. Staying hydrated  Follow instructions from your health care provider about hydration, which may include: ? Up to 2 hours before the procedure - you may continue to drink clear liquids, such as water, clear fruit juice, black coffee, and plain tea. Eating and drinking  Follow instructions from your health care provider about eating and drinking, which may include: ? 8 hours before the procedure - stop eating heavy meals or foods, such as meat, fried foods, or fatty foods. ? 6 hours before the procedure - stop eating light meals or foods, such as toast or cereal. ? 6 hours before the procedure - stop drinking milk or drinks that contain milk. ? 2 hours before the procedure - stop drinking clear liquids. General instructions  Do not use any products that contain nicotine or tobacco for at least 4 weeks before the procedure. These products include cigarettes, e-cigarettes, and chewing tobacco. If you need help quitting, ask your health care provider.  Plan to have someone take you home from the hospital.  If you will be going home right after the procedure, plan to have someone with you for 24 hours.  Ask your health care provider: ? How your surgery site will be marked. ? What steps will be taken to help prevent infection. These may include:  Removing hair at the surgery site.  Washing skin with a germ-killing soap.  Taking antibiotic medicine. What happens during the procedure?      An IV will be inserted into one of your veins.  You will be given one or more of the following: ? A medicine to help you relax (sedative). ? A medicine to numb the area (local anesthetic). ? A medicine to make  you fall asleep (general anesthetic). ? A medicine that is injected into an area of your body to numb everything below the injection site (regional anesthetic).  Your bladder may be emptied with a small tube (catheter).  If you have been given a general anesthetic, a tube will be put down your throat to help you breathe.  Two small incisions will be made in your lower abdomen and near your belly  button.  Your abdomen will be inflated with a gas. This will let the surgeon see better and will give the surgeon room to work.  A thin, lighted tube (laparoscope) with a camera attached will be inserted into your abdomen through one of the incisions. Small instruments will be inserted through the other incision.  The fallopian tubes will be tied off, burned (cauterized), or blocked with a clip, ring, or clamp. A small portion in the center of each fallopian tube may be removed.  The gas will be released from the abdomen.  The incisions will be closed with stitches (sutures).  A bandage (dressing) will be placed over the incisions. The procedure may vary among health care providers and hospitals. What happens after the procedure?  Your blood pressure, heart rate, breathing rate, and blood oxygen level will be monitored until you leave the hospital.  You will be given medicine to help with pain, nausea, and vomiting as needed. Summary  Laparoscopic tubal ligation is a procedure that is done so that you cannot get pregnant.  You should not have this procedure if you want to get pregnant someday or if you are unsure about having more children.  The procedure is done using a thin, lighted tube (laparoscope) with a camera attached that will be inserted into your abdomen through an incision.  Follow instructions from your health care provider about eating and drinking before the procedure. This information is not intended to replace advice given to you by your health care provider. Make sure you  discuss any questions you have with your health care provider. Document Revised: 12/19/2018 Document Reviewed: 06/06/2018 Elsevier Patient Education  2020 [employer].

## 2019-10-19 NOTE — Progress Notes (Signed)
ANNUAL PREVENTATIVE CARE GYN  ENCOUNTER NOTE  Subjective:       Briana Cabrera is a 44 y.o. G52P1001 female here for a routine annual gynecologic exam.  Current complaints: 1. Needs Pap smear 2. Desires Lo Loestrin refill, considering BTL 3. Questions medical weight loss options  Denies difficulty breathing or respiratory distress, chest pain, abdominal pain, excessive vaginal bleeding, dysuria, and leg pain.    Gynecologic History  No LMP recorded. (Menstrual status: Oral contraceptives).  Contraception: OCP (estrogen/progesterone), Lo Loestrin  Last Pap: due.   Last mammogram: 2019. Results were: normal per patient  Obstetric History  OB History  Gravida Para Term Preterm AB Living  1 1 1     1   SAB TAB Ectopic Multiple Live Births          1    # Outcome Date GA Lbr Len/2nd Weight Sex Delivery Anes PTL Lv  1 Term 03/15/07   9 lb 10 oz (4.366 kg) F Vag-Spont  N LIV    Past Medical History:  Diagnosis Date  . Cancer ([place])    skin  . GERD (gastroesophageal reflux disease)     Past Surgical History:  Procedure Laterality Date  . UPPER GI ENDOSCOPY      Current Outpatient Medications on File Prior to Visit  Medication Sig Dispense Refill  . cyanocobalamin (,VITAMIN B-12,) 1000 MCG/ML injection Inject 1 mL (1,000 mcg total) into the muscle every 30 (thirty) days. 10 mL 1  . ergocalciferol (VITAMIN D2) 1.25 MG (50000 UT) capsule Take 1 capsule (50,000 Units total) by mouth once a week. 4 capsule 5  . Norethindrone-Ethinyl Estradiol-Fe Biphas (LO LOESTRIN FE) 1 MG-10 MCG / 10 MCG tablet Take 1 tablet by mouth daily. 3 Package 2  . pantoprazole (PROTONIX) 40 MG tablet Take by mouth.    . etonogestrel-ethinyl estradiol (NUVARING) 0.12-0.015 MG/24HR vaginal ring Insert vaginally and leave in place for 3 consecutive weeks, then remove for 1 week. (Patient not taking: Reported on 10/19/2019) 1 each 12  . methylPREDNISolone (MEDROL DOSEPAK) 4 MG TBPK tablet 6 day dose pack -  take as directed (Patient not taking: Reported on 10/19/2019) 21 tablet 0  . metroNIDAZOLE (METROGEL VAGINAL) 0.75 % vaginal gel Place 1 Applicatorful vaginally 2 (two) times daily. (Patient not taking: Reported on 10/19/2019) 70 g 2  . pantoprazole (PROTONIX) 40 MG tablet Take by mouth.    . phentermine 37.5 MG capsule Take 1 capsule (37.5 mg total) by mouth every morning. (Patient not taking: Reported on 10/19/2019) 30 capsule 0   No current facility-administered medications on file prior to visit.    No Known Allergies  Social History   Socioeconomic History  . Marital status: Married    Spouse name: Not on file  . Number of children: 1  . Years of education: Not on file  . Highest education level: Not on file  Occupational History  . Occupation: Patient Care Coordinator    Comment: [employer]  Tobacco Use  . Smoking status: Never Smoker  . Smokeless tobacco: Never Used  Substance and Sexual Activity  . Alcohol use: No    Alcohol/week: 0.0 standard drinks  . Drug use: No  . Sexual activity: Yes    Partners: Male    Birth control/protection: Pill    Comment: Mirena  Other Topics Concern  . Not on file  Social History Narrative  . Not on file   Social Determinants of Health   Financial Resource Strain:   .  Difficulty of Paying Living Expenses:   Food Insecurity:   . Worried About Charity fundraiser in the Last Year:   . Arboriculturist in the Last Year:   Transportation Needs:   . Film/video editor (Medical):   Marland Kitchen Lack of Transportation (Non-Medical):   Physical Activity:   . Days of Exercise per Week:   . Minutes of Exercise per Session:   Stress:   . Feeling of Stress :   Social Connections:   . Frequency of Communication with Friends and Family:   . Frequency of Social Gatherings with Friends and Family:   . Attends Religious Services:   . Active Member of Clubs or Organizations:   . Attends Archivist Meetings:   Marland Kitchen Marital Status:    Intimate Partner Violence:   . Fear of Current or Ex-Partner:   . Emotionally Abused:   Marland Kitchen Physically Abused:   . Sexually Abused:     Family History  Problem Relation Age of Onset  . Hypertension Mother   . Heart disease Father   . Hypertension Father   . Diabetes Father   . Heart disease Brother   . Hypertension Brother   . Diabetes Brother   . Breast cancer Neg Hx   . Ovarian cancer Neg Hx   . Colon cancer Neg Hx     The following portions of the patient's history were reviewed and updated as appropriate: allergies, current medications, past family history, past medical history, past social history, past surgical history and problem list.  Review of Systems  ROS negative except as noted above. Information obtained from patient.    Objective:   BP 121/80   Pulse (!) 107   Ht 5\' 7"  (1.702 m)   Wt 230 lb 4 oz (104.4 kg)   BMI 36.06 kg/m    CONSTITUTIONAL: Well-developed, well-nourished female in no acute distress.   PSYCHIATRIC: Normal mood and affect. Normal behavior. Normal judgment and thought content.  [place]: Alert and oriented to person, place, and time. Normal muscle tone coordination. No cranial nerve deficit noted.  HENT:  Normocephalic, atraumatic, External right and left ear normal.   EYES: Conjunctivae and EOM are normal. Pupils are equal and round.   NECK: Normal range of motion, supple, no masses.  Normal thyroid.   SKIN: Skin is warm and dry. No rash noted. Not diaphoretic. No erythema. No pallor.   CARDIOVASCULAR: Normal heart rate noted, regular rhythm, no murmur.  RESPIRATORY: Clear to auscultation bilaterally. Effort and breath sounds normal, no problems with respiration noted.  BREASTS: Symmetric in size. No masses, skin changes, nipple drainage, or lymphadenopathy.  ABDOMEN: Soft, normal bowel sounds, no distention noted.  No tenderness, rebound or guarding.   PELVIC:  External Genitalia: Normal  BUS: Normal  Vagina:  Normal  Cervix: Normal, Pap collected  Uterus: Normal  Adnexa: Normal   MUSCULOSKELETAL: Normal range of motion. No tenderness.  No cyanosis, clubbing, or edema.  2+ distal pulses.  LYMPHATIC: No Axillary, Supraclavicular, or Inguinal Adenopathy.  Assessment:   Annual gynecologic examination 44 y.o.   Contraception: OCP (estrogen/progesterone), Lo Loestrin; considering BTL   Obesity 2   Problem List Items Addressed This Visit    None    Visit Diagnoses    Well woman exam    -  Primary   Relevant Orders   Cytology - PAP   Screening for cervical cancer       Relevant Orders   Cytology - PAP  Encounter for screening mammogram for malignant neoplasm of breast       BMI 36.0-36.9,adult          Plan:   Pap: Pap Co Test  Mammogram: Ordered  Labs: Declined   Routine preventative health maintenance measures emphasized: Medical weight loss options, Exercise/Diet/Weight control, Tobacco Warnings, Alcohol/Substance use risks, Stress Management and Peer Pressure Issues; see AVS and handouts provided  Rx: Lo Loestrin, see orders  Reviewed red flag symptoms and when to call  Return to Clinic - 1 Year for [employer] or for BTL consultation with MD   Diona Fanti, CNM [facility], [facility]

## 2019-10-25 ENCOUNTER — Encounter: Payer: Self-pay | Admitting: Certified Nurse Midwife

## 2019-10-25 DIAGNOSIS — R87810 Cervical high risk human papillomavirus (HPV) DNA test positive: Secondary | ICD-10-CM | POA: Insufficient documentation

## 2019-10-25 LAB — CYTOLOGY - PAP
Comment: NEGATIVE
Comment: NEGATIVE
Diagnosis: NEGATIVE
HPV 16: NEGATIVE
HPV 18 / 45: NEGATIVE
High risk HPV: POSITIVE — AB

## 2019-12-20 ENCOUNTER — Telehealth: Payer: Self-pay

## 2019-12-20 MED ORDER — ETONOGESTREL-ETHINYL ESTRADIOL 0.12-0.015 MG/24HR VA RING: 1.0000 | VAGINAL_RING | VAGINAL | 12 refills | Status: DC

## 2019-12-20 NOTE — Telephone Encounter (Signed)
Pt [facility]. She also wants a sample.  Pt aware [facility]. Per JML no samples as nuaring as went generic.

## 2020-03-03 ENCOUNTER — Ambulatory Visit: Payer: Medicaid Other | Attending: Internal Medicine

## 2020-03-03 DIAGNOSIS — Z23 Encounter for immunization: Secondary | ICD-10-CM

## 2020-03-03 NOTE — Progress Notes (Signed)
   Covid-19 Vaccination Clinic  Name:  SAVAYAH WALTRIP    MRN: [id number] DOB: 1975/09/22  03/03/2020  Ms. Slezak was observed post Covid-19 immunization for 15 minutes without incident. She was provided with Vaccine Information Sheet and instruction to access the V-Safe system.   Ms. Dry was instructed to call 911 with any severe reactions post vaccine: Marland Kitchen Difficulty breathing  . Swelling of face and throat  . A fast heartbeat  . A bad rash all over body  . Dizziness and weakness   Immunizations Administered    Name Date Dose VIS Date Route   Moderna COVID-19 Vaccine 03/03/2020 12:24 PM 0.5 mL 06/2019 Intramuscular   Manufacturer: Levan Hurst   Lot: [id number]   NDC: [id number]

## 2020-03-07 ENCOUNTER — Emergency Department: Payer: Medicaid Other

## 2020-03-07 ENCOUNTER — Other Ambulatory Visit: Payer: Self-pay

## 2020-03-07 ENCOUNTER — Telehealth: Payer: Self-pay | Admitting: Surgical

## 2020-03-07 ENCOUNTER — Ambulatory Visit (INDEPENDENT_AMBULATORY_CARE_PROVIDER_SITE_OTHER): Payer: Medicaid Other | Admitting: Certified Nurse Midwife

## 2020-03-07 ENCOUNTER — Emergency Department
Admission: EM | Admit: 2020-03-07 | Discharge: 2020-03-07 | Disposition: A | Discharge status: Home / Self Care | Payer: Medicaid Other | Attending: Emergency Medicine | Admitting: Emergency Medicine

## 2020-03-07 VITALS — BP 121/80 | HR 86 | Ht 67.0 in | Wt 230.0 lb

## 2020-03-07 DIAGNOSIS — R5383 Other fatigue: Secondary | ICD-10-CM | POA: Diagnosis not present

## 2020-03-07 DIAGNOSIS — R6883 Chills (without fever): Secondary | ICD-10-CM | POA: Diagnosis not present

## 2020-03-07 DIAGNOSIS — R109 Unspecified abdominal pain: Secondary | ICD-10-CM | POA: Diagnosis present

## 2020-03-07 DIAGNOSIS — R000 Tachycardia, unspecified: Secondary | ICD-10-CM | POA: Insufficient documentation

## 2020-03-07 DIAGNOSIS — M545 Low back pain, unspecified: Secondary | ICD-10-CM

## 2020-03-07 DIAGNOSIS — N200 Calculus of kidney: Secondary | ICD-10-CM

## 2020-03-07 DIAGNOSIS — Z85828 Personal history of other malignant neoplasm of skin: Secondary | ICD-10-CM | POA: Insufficient documentation

## 2020-03-07 DIAGNOSIS — K219 Gastro-esophageal reflux disease without esophagitis: Secondary | ICD-10-CM | POA: Diagnosis not present

## 2020-03-07 DIAGNOSIS — R42 Dizziness and giddiness: Secondary | ICD-10-CM | POA: Diagnosis not present

## 2020-03-07 DIAGNOSIS — N23 Unspecified renal colic: Secondary | ICD-10-CM

## 2020-03-07 LAB — URINALYSIS, COMPLETE (UACMP) WITH MICROSCOPIC
Bacteria, UA: NONE SEEN
Bilirubin Urine: NEGATIVE
Glucose, UA: NEGATIVE mg/dL
Hgb urine dipstick: NEGATIVE
Ketones, ur: 20 mg/dL — AB
Leukocytes,Ua: NEGATIVE
Nitrite: POSITIVE — AB
Protein, ur: NEGATIVE mg/dL
Specific Gravity, Urine: 1.01 (ref 1.005–1.030)
pH: 7 (ref 5.0–8.0)

## 2020-03-07 LAB — CBC WITH DIFFERENTIAL/PLATELET
Abs Immature Granulocytes: 0.04 10*3/uL (ref 0.00–0.07)
Basophils Absolute: 0.1 10*3/uL (ref 0.0–0.1)
Basophils Relative: 1 %
Eosinophils Absolute: 0.1 10*3/uL (ref 0.0–0.5)
Eosinophils Relative: 1 %
HCT: 40.9 % (ref 36.0–46.0)
Hemoglobin: 13.6 g/dL (ref 12.0–15.0)
Immature Granulocytes: 0 %
Lymphocytes Relative: 19 %
Lymphs Abs: 2.4 10*3/uL (ref 0.7–4.0)
MCH: 29.8 pg (ref 26.0–34.0)
MCHC: 33.3 g/dL (ref 30.0–36.0)
MCV: 89.7 fL (ref 80.0–100.0)
Monocytes Absolute: 1 10*3/uL (ref 0.1–1.0)
Monocytes Relative: 8 %
Neutro Abs: 9.1 10*3/uL — ABNORMAL HIGH (ref 1.7–7.7)
Neutrophils Relative %: 71 %
Platelets: 277 10*3/uL (ref 150–400)
RBC: 4.56 MIL/uL (ref 3.87–5.11)
RDW: 12.1 % (ref 11.5–15.5)
WBC: 12.8 10*3/uL — ABNORMAL HIGH (ref 4.0–10.5)
nRBC: 0 % (ref 0.0–0.2)

## 2020-03-07 LAB — POCT URINALYSIS DIPSTICK
Bilirubin, UA: NEGATIVE
Glucose, UA: NEGATIVE
Ketones, UA: NEGATIVE
Leukocytes, UA: NEGATIVE
Nitrite, UA: NEGATIVE
Protein, UA: NEGATIVE
Spec Grav, UA: 1.01 (ref 1.010–1.025)
Urobilinogen, UA: 0.2 E.U./dL
pH, UA: 6 (ref 5.0–8.0)

## 2020-03-07 LAB — COMPREHENSIVE METABOLIC PANEL
ALT: 16 U/L (ref 0–44)
AST: 14 U/L — ABNORMAL LOW (ref 15–41)
Albumin: 3.8 g/dL (ref 3.5–5.0)
Alkaline Phosphatase: 56 U/L (ref 38–126)
Anion gap: 11 (ref 5–15)
BUN: 16 mg/dL (ref 6–20)
CO2: 23 mmol/L (ref 22–32)
Calcium: 8.9 mg/dL (ref 8.9–10.3)
Chloride: 107 mmol/L (ref 98–111)
Creatinine, Ser: 0.69 mg/dL (ref 0.44–1.00)
GFR calc Af Amer: >60 mL/min (ref >60)
GFR calc non Af Amer: >60 mL/min (ref >60)
Glucose, Bld: 134 mg/dL — ABNORMAL HIGH (ref 70–99)
Potassium: 3.4 mmol/L — ABNORMAL LOW (ref 3.5–5.1)
Sodium: 141 mmol/L (ref 135–145)
Total Bilirubin: 0.6 mg/dL (ref 0.3–1.2)
Total Protein: 7.7 g/dL (ref 6.5–8.1)

## 2020-03-07 LAB — PREGNANCY, URINE: Preg Test, Ur: NEGATIVE

## 2020-03-07 LAB — LACTIC ACID, PLASMA: Lactic Acid, Venous: 0.8 mmol/L (ref 0.5–1.9)

## 2020-03-07 MED ORDER — HYDROMORPHONE HCL 1 MG/ML IJ SOLN
0.5000 mg | Freq: Once | INTRAMUSCULAR | Status: AC
  Administered 2020-03-07: 0.5 mg via INTRAVENOUS

## 2020-03-07 MED ORDER — KETOROLAC TROMETHAMINE 30 MG/ML IJ SOLN
15.0000 mg | Freq: Once | INTRAMUSCULAR | Status: AC
  Administered 2020-03-07: 15 mg via INTRAVENOUS
  Filled 2020-03-07: qty 1

## 2020-03-07 MED ORDER — NITROFURANTOIN MONOHYD MACRO 100 MG PO CAPS: 100.0000 mg | ORAL_CAPSULE | Freq: Two times a day (BID) | ORAL | 0 refills | Status: DC

## 2020-03-07 MED ORDER — ONDANSETRON HCL 4 MG/2ML IJ SOLN
4.0000 mg | Freq: Once | INTRAMUSCULAR | Status: AC
  Administered 2020-03-07: 4 mg via INTRAVENOUS

## 2020-03-07 MED ORDER — SODIUM CHLORIDE 0.9 % IV BOLUS
1000.0000 mL | Freq: Once | INTRAVENOUS | Status: AC
  Administered 2020-03-07: 1000 mL via INTRAVENOUS

## 2020-03-07 MED ORDER — IBUPROFEN 600 MG PO TABS
600.0000 mg | ORAL_TABLET | Freq: Three times a day (TID) | ORAL | 0 refills | Status: AC | PRN
Start: 2020-03-07 — End: ?

## 2020-03-07 MED ORDER — ONDANSETRON 4 MG PO TBDP
4.0000 mg | ORAL_TABLET | Freq: Three times a day (TID) | ORAL | 0 refills | Status: DC | PRN
Start: 2020-03-07 — End: 2021-07-22

## 2020-03-07 MED ORDER — HYDROCODONE-ACETAMINOPHEN 5-325 MG PO TABS: 1.0000 | ORAL_TABLET | Freq: Four times a day (QID) | ORAL | 0 refills | Status: AC | PRN

## 2020-03-07 MED ORDER — SODIUM CHLORIDE 0.9 % IV SOLN
2.0000 g | Freq: Once | INTRAVENOUS | Status: AC
  Administered 2020-03-07: 2 g via INTRAVENOUS
  Filled 2020-03-07: qty 20

## 2020-03-07 MED ORDER — HYDROMORPHONE HCL 1 MG/ML IJ SOLN
1.0000 mg | Freq: Once | INTRAMUSCULAR | Status: AC
  Administered 2020-03-07: 1 mg via INTRAVENOUS
  Filled 2020-03-07: qty 1

## 2020-03-07 MED ORDER — PHENAZOPYRIDINE HCL 200 MG PO TABS: 200.0000 mg | ORAL_TABLET | Freq: Three times a day (TID) | ORAL | 1 refills | Status: DC | PRN

## 2020-03-07 MED ORDER — HYDROMORPHONE HCL 1 MG/ML IJ SOLN
1.0000 mg | Freq: Once | INTRAMUSCULAR | Status: DC
  Filled 2020-03-07: qty 1

## 2020-03-07 NOTE — Telephone Encounter (Signed)
pts daughter called in and stated that she needs to talk to a nurse. I asked her what the pts name was and DOB. The pt stated that it was her mother. I told her im Donnel Saxon but your not on her DPR. Then a lady answered and stated that she was the pts sister Izora Gala) Izora Gala stated that her sister was seen here today and that she dropped off urine sample and when she let she went to pick up some meds. When the pt returned home the pt said she felt a sharpe pain and fell to the floor and started throwing up. I told Izora Gala to please hold and called back to [place] and told her what the lady told me. JW took over the phone call.

## 2020-03-07 NOTE — ED Triage Notes (Signed)
Pt going to [facility] clinic brought here for extreme flank pain on right sided. Pt clammy and yelling on arrival. Pt c/o urinary pain. Pt AOX4, appears uncomfortable.

## 2020-03-07 NOTE — Telephone Encounter (Signed)
Patients daughter called in about her mother. I let the daughter know that I was not able to give information due to not being on DPR.  Patient was seen this morning for a urine drop off. Patient had blood in her urine. Urine was sent for a culture.   Patient picked up Macrobid from the pharmacy and when she got home she was in the floor with severe pain. Patient was vomiting as well. I let them know that she would need to be seen at urgent care or at the ED. The patients daughter ask if it could be a kidney stone. I told her that it is possible but the doctor would have to determine that. She is going to take patient to [place] clinic to be seen.

## 2020-03-07 NOTE — ED Provider Notes (Signed)
[facility] Emergency Department Provider Note  ____________________________________________   First MD Initiated Contact with Patient 03/07/20 1648     (approximate)  I have reviewed the triage vital signs and the nursing notes.   HISTORY  Chief Complaint Flank Pain    HPI [name] is a 44 y.o. female  With PMHx GERD here with flank pain. Pt arrives from [facility] with MD, staff. She reportedly arrived with complaints of feeling weak like she was going to pass out. She reportedly was diagnosed with a UTI yesterday and started on ABX, had been having some lower abd fullness, dysuria but this acutely worsened, and she now has severe flank pain with it. She has had associated nausea, lightheadedness. She feels weak and like she is going to pass out. Remainder of history limited 2/2 distress. No CP, SOB. No known fevers. Pain is 10/10, severe, bilateral but R>L flanks. No significant alleviating or aggravating factors.       Past Medical History:  Diagnosis Date  . Cancer ([place])    skin  . GERD (gastroesophageal reflux disease)     Patient Active Problem List   Diagnosis Date Noted  . Cervical high risk human papillomavirus (HPV) DNA test positive 10/25/2019  . Other fatigue 05/23/2015  . Sinus tachycardia 05/23/2015  . History of skin cancer 01/17/2015  . Overweight 01/17/2015  . GERD without esophagitis 01/17/2015    Past Surgical History:  Procedure Laterality Date  . UPPER GI ENDOSCOPY      Prior to Admission medications   Medication Sig Start Date End Date Taking? Authorizing Provider  etonogestrel-ethinyl estradiol (NUVARING) 0.12-0.015 MG/24HR vaginal ring Place 1 each vaginally every 28 (twenty-eight) days. Insert vaginally and leave in place for 3 consecutive weeks, then remove for 1 week. 12/20/19  Yes Lawhorn, Lara Mulch, CNM  nitrofurantoin, macrocrystal-monohydrate, (MACROBID) 100 MG capsule Take 1 capsule (100 mg  total) by mouth 2 (two) times daily. 03/07/20  Yes Lawhorn, Lara Mulch, CNM  pantoprazole (PROTONIX) 40 MG tablet Take 40 mg by mouth daily.  08/13/19  Yes [provider]  phenazopyridine (PYRIDIUM) 200 MG tablet Take 1 tablet (200 mg total) by mouth 3 (three) times daily as needed for pain (urethral spasm). 03/07/20  Yes Lawhorn, Lara Mulch, CNM  Phendimetrazine Tartrate 35 MG TABS Take 1 tablet by mouth 3 (three) times daily. 01/09/20  Yes [provider]  cyanocobalamin (,VITAMIN B-12,) 1000 MCG/ML injection Inject 1 mL (1,000 mcg total) into the muscle every 30 (thirty) days. Patient not taking: Reported on 03/07/2020 04/14/18   Joylene Igo, CNM  ergocalciferol (VITAMIN D2) 1.25 MG (50000 UT) capsule Take 1 capsule (50,000 Units total) by mouth once a week. Patient not taking: Reported on 03/07/2020 06/29/19   Philip Aspen, CNM  methylPREDNISolone (MEDROL DOSEPAK) 4 MG TBPK tablet 6 day dose pack - take as directed Patient not taking: Reported on 10/19/2019 07/28/18   Edrick Kins, DPM  metroNIDAZOLE (METROGEL VAGINAL) 0.75 % vaginal gel Place 1 Applicatorful vaginally 2 (two) times daily. Patient not taking: Reported on 10/19/2019 03/07/19   Shambley, Melody N, CNM  Norethindrone-Ethinyl Estradiol-Fe Biphas (LO LOESTRIN FE) 1 MG-10 MCG / 10 MCG tablet Take 1 tablet by mouth daily. Patient not taking: Reported on 03/07/2020 10/19/19   Diona Fanti, CNM  phentermine 37.5 MG capsule Take 1 capsule (37.5 mg total) by mouth every morning. Patient not taking: Reported on 10/19/2019 04/26/18   Joylene Igo, CNM  Allergies Patient has no known allergies.  Family History  Problem Relation Age of Onset  . Hypertension Mother   . Heart disease Father   . Hypertension Father   . Diabetes Father   . Heart disease Brother   . Hypertension Brother   . Diabetes Brother   . Breast cancer Neg Hx   . Ovarian cancer Neg Hx   . Colon cancer Neg Hx      Social History Social History   Tobacco Use  . Smoking status: Never Smoker  . Smokeless tobacco: Never Used  Vaping Use  . Vaping Use: Never used  Substance Use Topics  . Alcohol use: No    Alcohol/week: 0.0 standard drinks  . Drug use: No    Review of Systems  Review of Systems  Constitutional: Positive for chills and fatigue. Negative for fever.  HENT: Negative for congestion and sore throat.   Eyes: Negative for visual disturbance.  Respiratory: Negative for cough and shortness of breath.   Cardiovascular: Negative for chest pain.  Gastrointestinal: Negative for abdominal pain, diarrhea, nausea and vomiting.  Genitourinary: Positive for dysuria, flank pain and frequency.  Musculoskeletal: Negative for back pain and neck pain.  Skin: Negative for rash and wound.  Neurological: Positive for light-headedness. Negative for weakness.  All other systems reviewed and are negative.    ____________________________________________  PHYSICAL EXAM:      VITAL SIGNS: ED Triage Vitals  Enc Vitals Group     BP 03/07/20 1700 (!) 139/92     Pulse Rate 03/07/20 1700 88     Resp 03/07/20 1701 20     Temp 03/07/20 1701 98.7 F (37.1 C)     Temp Source 03/07/20 1701 Oral     SpO2 03/07/20 1700 99 %     Weight 03/07/20 1701 229 lb 4.5 oz (104 kg)     Height 03/07/20 1701 5\' 7"  (1.702 m)     Head Circumference --      Peak Flow --      Pain Score 03/07/20 1701 10     Pain Loc --      Pain Edu? --      Excl. in [place]? --      Physical Exam Vitals and nursing note reviewed.  Constitutional:      General: She is not in acute distress.    Appearance: She is well-developed. She is diaphoretic.     Comments: Appears to be in pain, uncomfortable  HENT:     Head: Normocephalic and atraumatic.  Eyes:     Conjunctiva/sclera: Conjunctivae normal.  Cardiovascular:     Rate and Rhythm: Regular rhythm. Tachycardia present.     Heart sounds: Normal heart sounds. No murmur heard.   No friction rub.  Pulmonary:     Effort: Pulmonary effort is normal. No respiratory distress.     Breath sounds: Normal breath sounds. No wheezing or rales.  Abdominal:     General: There is no distension.     Palpations: Abdomen is soft.     Tenderness: There is no abdominal tenderness.     Comments: Diffuse TTP, no significant focal tenderness, no distension  Musculoskeletal:     Cervical back: Neck supple.  Skin:    General: Skin is warm.     Capillary Refill: Capillary refill takes less than 2 seconds.  Neurological:     Mental Status: She is alert and oriented to person, place, and time.     Motor: No abnormal  muscle tone.       ____________________________________________   LABS (all labs ordered are listed, but only abnormal results are displayed)  Labs Reviewed  CBC WITH DIFFERENTIAL/PLATELET - Abnormal; Notable for the following components:      Result Value   WBC 12.8 (*)    Neutro Abs 9.1 (*)    All other components within normal limits  COMPREHENSIVE METABOLIC PANEL - Abnormal; Notable for the following components:   Potassium 3.4 (*)    Glucose, Bld 134 (*)    AST 14 (*)    All other components within normal limits  URINALYSIS, COMPLETE (UACMP) WITH MICROSCOPIC - Abnormal; Notable for the following components:   Color, Urine AMBER (*)    APPearance CLEAR (*)    Ketones, ur 20 (*)    Nitrite POSITIVE (*)    All other components within normal limits  URINE CULTURE  LACTIC ACID, PLASMA  PREGNANCY, URINE    ____________________________________________  EKG: Normal sinus rhythm, VR 94. QRS 84, QTc 499. Borderline prolonged QT, otherwise no acute ischemic changes. No ST elevations. ________________________________________  RADIOLOGY All imaging, including plain films, CT scans, and ultrasounds, independently reviewed by me, and interpretations confirmed via formal radiology reads.  ED MD interpretation:   CT: Mild R hydro from 4 mm bladder calculus,  likely passed stone  Official radiology report(s): CT Renal Stone Study  Result Date: 03/07/2020 CLINICAL DATA:  Right-sided flank pain EXAM: CT ABDOMEN AND PELVIS WITHOUT CONTRAST TECHNIQUE: Multidetector CT imaging of the abdomen and pelvis was performed following the standard protocol without IV contrast. COMPARISON:  None. FINDINGS: Lower chest: The visualized heart size within normal limits. No pericardial fluid/thickening. No hiatal hernia. The visualized portions of the lungs are clear. Hepatobiliary: Although limited due to the lack of intravenous contrast, normal in appearance without gross focal abnormality. No evidence of calcified gallstones or biliary ductal dilatation. Pancreas:  Unremarkable.  No surrounding inflammatory changes. Spleen: Normal in size. Although limited due to the lack of intravenous contrast, normal in appearance. Adrenals/Urinary Tract: Both adrenal glands appear normal. There is a 4 mm calculus seen in the posterior right bladder causing right pelvicaliectasis and ureterectasis to the level of the UVJ. There is also mild perinephric and periureteral fat stranding changes. No left-sided renal or system calculi are seen. The bladder is partially decompressed with apparent thickening. Stomach/Bowel: The stomach, small bowel, and colon are normal in appearance. No inflammatory changes or obstructive findings. appendix is normal. Vascular/Lymphatic: There are no enlarged abdominal or pelvic lymph nodes. No significant gross vascular findings are present. Reproductive: The uterus and adnexa are unremarkable. Other: No evidence of abdominal wall mass or hernia. Musculoskeletal: No acute or significant osseous findings. IMPRESSION: Mild right hydronephrosis with a posterior right 4 mm bladder calculus which is likely from recently passed stone. There is also mild right perinephric and periureteral stranding. Electronically Signed   By: Prudencio Pair M.D.   On: 03/07/2020 19:53     ____________________________________________  PROCEDURES   Procedure(s) performed (including Critical Care):  Procedures  ____________________________________________  INITIAL IMPRESSION / MDM / [place] / ED COURSE  As part of my medical decision making, I reviewed the following data within the [place] notes reviewed and incorporated, Old chart reviewed, Notes from prior ED visits, and [place] Controlled Substance Database       *Briana Cabrera was evaluated in Emergency Department on 03/07/2020 for the symptoms described in the history of present illness. She  was evaluated in the context of the global COVID-19 pandemic, which necessitated consideration that the patient might be at risk for infection with the SARS-CoV-2 virus that causes COVID-19. Institutional protocols and algorithms that pertain to the evaluation of patients at risk for COVID-19 are in a state of rapid change based on information released by regulatory bodies including the CDC and federal and state organizations. These policies and algorithms were followed during the patient's care in the ED.  Some ED evaluations and interventions may be delayed as a result of limited staffing during the pandemic.*     Medical Decision Making:  44 yo F here with severe R flank pain and nausea, vomiting. On arrival, tp in mod distress 2/2 pain but HDS, afebrile. Labs show mild, likely reactive leukocytosis. Renal function is normal. LA normal. UPT negative. UA with pos nitrites (possibly azo), no pyuria, bacteriuria however. CT scan is c/w likely recently passed stone (4 mm). Pt feels much better.  Suspect passed stone. No signs of UTI on UA and suspect nitrite is interference. Will have her hydrate at home, monitor for ongoing urinary sx, and d/c.   ____________________________________________  FINAL CLINICAL IMPRESSION(S) / ED DIAGNOSES  Final diagnoses:  None     MEDICATIONS GIVEN  DURING THIS VISIT:  Medications  ondansetron (ZOFRAN) injection 4 mg (4 mg Intravenous Given 03/07/20 1650)  sodium chloride 0.9 % bolus 1,000 mL (0 mLs Intravenous Stopped 03/07/20 1756)  ketorolac (TORADOL) 30 MG/ML injection 15 mg (15 mg Intravenous Given 03/07/20 1657)  HYDROmorphone (DILAUDID) injection 0.5 mg (0.5 mg Intravenous Given 03/07/20 1656)  sodium chloride 0.9 % bolus 1,000 mL (1,000 mLs Intravenous New Bag/Given 03/07/20 1841)  cefTRIAXone (ROCEPHIN) 2 g in sodium chloride 0.9 % 100 mL IVPB (0 g Intravenous Stopped 03/07/20 1911)  HYDROmorphone (DILAUDID) injection 1 mg (1 mg Intravenous Given 03/07/20 1839)  ketorolac (TORADOL) 30 MG/ML injection 15 mg (15 mg Intravenous Given 03/07/20 2012)     ED Discharge Orders    None       Note:  This document was prepared using Dragon voice recognition software and may include unintentional dictation errors.   Duffy Bruce, MD 03/07/20 2107

## 2020-03-07 NOTE — ED Notes (Signed)
Pt taken to CT.

## 2020-03-07 NOTE — Progress Notes (Signed)
Yellow Patient comes in today for a urine drop off. Patient is having lower back pain. Per Sharyn Lull I am going to send in [place] and Pyridium to the pharmacy.

## 2020-03-09 LAB — URINE CULTURE
Culture: <10000 — AB
Organism ID, Bacteria: NO GROWTH

## 2020-03-28 ENCOUNTER — Ambulatory Visit: Payer: Medicaid Other | Admitting: Certified Nurse Midwife

## 2020-04-04 ENCOUNTER — Ambulatory Visit: Payer: Medicaid Other

## 2020-09-22 ENCOUNTER — Encounter: Payer: Self-pay | Admitting: Certified Nurse Midwife

## 2020-10-24 ENCOUNTER — Encounter: Payer: Medicaid Other | Admitting: Certified Nurse Midwife

## 2020-11-28 ENCOUNTER — Encounter: Payer: Medicaid Other | Admitting: Certified Nurse Midwife

## 2021-02-26 NOTE — Patient Instructions (Signed)
Breast Self-Awareness Breast self-awareness is knowing how your breasts look and feel. Doing breast self-awareness is important. It allows you to catch a breast problem early while it is still small and can be treated. All women should do breast self-awareness, including women who have had breast implants. Tell your doctorif you notice a change in your breasts. What you need: A mirror. A well-lit room. How to do a breast self-exam A breast self-exam is one way to learn what is normal for your breasts and tocheck for changes. To do a breast self-exam: Look for changes  Take off all the clothes above your waist. Stand in front of a mirror in a room with good lighting. Put your hands on your hips. Push your hands down. Look at your breasts and nipples in the mirror to see if one breast or nipple looks different from the other. Check to see if: The shape of one breast is different. The size of one breast is different. There are wrinkles, dips, and bumps in one breast and not the other. Look at each breast for changes in the skin, such as: Redness. Scaly areas. Look for changes in your nipples, such as: Liquid around the nipples. Bleeding. Dimpling. Redness. A change in where the nipples are.  Feel for changes  Lie on your back on the floor. Feel each breast. To do this, follow these steps: Pick a breast to feel. Put the arm closest to that breast above your head. Use your other arm to feel the nipple area of your breast. Feel the area with the pads of your three middle fingers by making small circles with your fingers. For the first circle, press lightly. For the second circle, press harder. For the third circle, press even harder. Keep making circles with your fingers at the different pressures as you move down your breast. Stop when you feel your ribs. Move your fingers a little toward the center of your body. Start making circles with your fingers again, this time going up until  you reach your collarbone. Keep making up-and-down circles until you reach your armpit. Remember to keep using the three pressures. Feel the other breast in the same way. Sit or stand in the tub or shower. With soapy water on your skin, feel each breast the same way you did in step 2 when you were lying on the floor.  Write down what you find Writing down what you find can help you remember what to tell your doctor. Write down: What is normal for each breast. Any changes you find in each breast, including: The kind of changes you find. Whether you have pain. Size and location of any lumps. When you last had your menstrual period. General tips Check your breasts every month. If you are breastfeeding, the best time to check your breasts is after you feed your baby or after you use a breast pump. If you get menstrual periods, the best time to check your breasts is 5-7 days after your menstrual period is over. With time, you will become comfortable with the self-exam, and you will begin to know if there are changes in your breasts. Contact a doctor if you: See a change in the shape or size of your breasts or nipples. See a change in the skin of your breast or nipples, such as red or scaly skin. Have fluid coming from your nipples that is not normal. Find a lump or thick area that was not there before. Have pain in  your breasts. Have any concerns about your breast health. Summary Breast self-awareness includes looking for changes in your breasts, as well as feeling for changes within your breasts. Breast self-awareness should be done in front of a mirror in a well-lit room. You should check your breasts every month. If you get menstrual periods, the best time to check your breasts is 5-7 days after your menstrual period is over. Let your doctor know of any changes you see in your breasts, including changes in size, changes on the skin, pain or tenderness, or fluid from your nipples that is  not normal. This information is not intended to replace advice given to you by your health care provider. Make sure you discuss any questions you have with your healthcare provider. Document Revised: 02/28/2018 Document Reviewed: 02/28/2018 Elsevier Patient Education  2022 Elsevier Inc. Preventive Care 21-39 Years Old, Female Preventive care refers to lifestyle choices and visits with your health care provider that can promote health and wellness. This includes: A yearly physical exam. This is also called an annual wellness visit. Regular dental and eye exams. Immunizations. Screening for certain conditions. Healthy lifestyle choices, such as: Eating a healthy diet. Getting regular exercise. Not using drugs or products that contain nicotine and tobacco. Limiting alcohol use. What can I expect for my preventive care visit? Physical exam Your health care provider may check your: Height and weight. These may be used to calculate your BMI (body mass index). BMI is a measurement that tells if you are at a healthy weight. Heart rate and blood pressure. Body temperature. Skin for abnormal spots. Counseling Your health care provider may ask you questions about your: Past medical problems. Family's medical history. Alcohol, tobacco, and drug use. Emotional well-being. Home life and relationship well-being. Sexual activity. Diet, exercise, and sleep habits. Work and work environment. Access to firearms. Method of birth control. Menstrual cycle. Pregnancy history. What immunizations do I need?  Vaccines are usually given at various ages, according to a schedule. Your health care provider will recommend vaccines for you based on your age, medicalhistory, and lifestyle or other factors, such as travel or where you work. What tests do I need?  Blood tests Lipid and cholesterol levels. These may be checked every 5 years starting at age 20. Hepatitis C test. Hepatitis B  test. Screening Diabetes screening. This is done by checking your blood sugar (glucose) after you have not eaten for a while (fasting). STD (sexually transmitted disease) testing, if you are at risk. BRCA-related cancer screening. This may be done if you have a family history of breast, ovarian, tubal, or peritoneal cancers. Pelvic exam and Pap test. This may be done every 3 years starting at age 21. Starting at age 30, this may be done every 5 years if you have a Pap test in combination with an HPV test. Talk with your health care provider about your test results, treatment options,and if necessary, the need for more tests. Follow these instructions at home: Eating and drinking  Eat a healthy diet that includes fresh fruits and vegetables, whole grains, lean protein, and low-fat dairy products. Take vitamin and mineral supplements as recommended by your health care provider. Do not drink alcohol if: Your health care provider tells you not to drink. You are pregnant, may be pregnant, or are planning to become pregnant. If you drink alcohol: Limit how much you have to 0-1 drink a day. Be aware of how much alcohol is in your drink. In the [place] one   drink equals one 12 oz bottle of beer (355 mL), one 5 oz glass of wine (148 mL), or one 1 oz glass of hard liquor (44 mL).  Lifestyle Take daily care of your teeth and gums. Brush your teeth every morning and night with fluoride toothpaste. Floss one time each day. Stay active. Exercise for at least 30 minutes 5 or more days each week. Do not use any products that contain nicotine or tobacco, such as cigarettes, e-cigarettes, and chewing tobacco. If you need help quitting, ask your health care provider. Do not use drugs. If you are sexually active, practice safe sex. Use a condom or other form of protection to prevent STIs (sexually transmitted infections). If you do not wish to become pregnant, use a form of birth control. If you plan to become  pregnant, see your health care provider for a prepregnancy visit. Find healthy ways to cope with stress, such as: Meditation, yoga, or listening to music. Journaling. Talking to a trusted person. Spending time with friends and family. Safety Always wear your seat belt while driving or riding in a vehicle. Do not drive: If you have been drinking alcohol. Do not ride with someone who has been drinking. When you are tired or distracted. While texting. Wear a helmet and other protective equipment during sports activities. If you have firearms in your house, make sure you follow all gun safety procedures. Seek help if you have been physically or sexually abused. What's next? Go to your health care provider once a year for an annual wellness visit. Ask your health care provider how often you should have your eyes and teeth checked. Stay up to date on all vaccines. This information is not intended to replace advice given to you by your health care provider. Make sure you discuss any questions you have with your healthcare provider. Document Revised: 03/09/2020 Document Reviewed: 03/23/2018 Elsevier Patient Education  2022 Elsevier Inc.  

## 2021-02-26 NOTE — Progress Notes (Signed)
error 

## 2021-02-27 ENCOUNTER — Encounter: Payer: Medicaid Other | Admitting: Obstetrics and Gynecology

## 2021-02-27 DIAGNOSIS — Z1231 Encounter for screening mammogram for malignant neoplasm of breast: Secondary | ICD-10-CM

## 2021-02-27 DIAGNOSIS — Z01419 Encounter for gynecological examination (general) (routine) without abnormal findings: Secondary | ICD-10-CM

## 2021-02-27 DIAGNOSIS — Z1211 Encounter for screening for malignant neoplasm of colon: Secondary | ICD-10-CM

## 2021-02-27 DIAGNOSIS — Z1159 Encounter for screening for other viral diseases: Secondary | ICD-10-CM

## 2021-03-02 ENCOUNTER — Encounter: Payer: Self-pay | Admitting: Certified Nurse Midwife

## 2021-04-01 ENCOUNTER — Encounter: Payer: Medicaid Other | Admitting: Certified Nurse Midwife

## 2021-04-06 ENCOUNTER — Encounter: Payer: Medicaid Other | Admitting: Certified Nurse Midwife

## 2021-05-11 ENCOUNTER — Encounter: Payer: Medicaid Other | Admitting: Certified Nurse Midwife

## 2021-07-22 ENCOUNTER — Ambulatory Visit (INDEPENDENT_AMBULATORY_CARE_PROVIDER_SITE_OTHER): Payer: Medicaid Other | Admitting: Certified Nurse Midwife

## 2021-07-22 ENCOUNTER — Other Ambulatory Visit: Payer: Self-pay

## 2021-07-22 ENCOUNTER — Encounter: Payer: Self-pay | Admitting: Certified Nurse Midwife

## 2021-07-22 ENCOUNTER — Other Ambulatory Visit (HOSPITAL_COMMUNITY)
Admission: RE | Admit: 2021-07-22 | Discharge: 2021-07-22 | Disposition: A | Discharge status: Home / Self Care | Payer: Medicaid Other | Source: Ambulatory Visit | Attending: Certified Nurse Midwife | Admitting: Certified Nurse Midwife

## 2021-07-22 VITALS — BP 129/87 | HR 89 | Ht 67.0 in | Wt 246.9 lb

## 2021-07-22 DIAGNOSIS — Z124 Encounter for screening for malignant neoplasm of cervix: Secondary | ICD-10-CM

## 2021-07-22 DIAGNOSIS — Z1231 Encounter for screening mammogram for malignant neoplasm of breast: Secondary | ICD-10-CM | POA: Diagnosis not present

## 2021-07-22 DIAGNOSIS — Z01419 Encounter for gynecological examination (general) (routine) without abnormal findings: Secondary | ICD-10-CM

## 2021-07-22 NOTE — Patient Instructions (Signed)

## 2021-07-22 NOTE — Progress Notes (Signed)
GYNECOLOGY ANNUAL PREVENTATIVE CARE ENCOUNTER NOTE  History:     Briana Cabrera is a 45 y.o. [id number] female here for a routine annual gynecologic exam.  Current complaints: weight gain and fatigue.   Denies abnormal vaginal bleeding, discharge, pelvic pain, problems with intercourse or other gynecologic concerns.     Social Relationship: boyfriend Living:with her 55 yr old daughter (divorced) Work: Facilities manager office Exercise: none Smoke/Alcohol/drug use: none  Gynecologic History Patient's last menstrual period was 07/08/2021 (exact date). Contraception: Stopped OCP, boyfriend has vasectomy Last Pap: 10/19/2019. Results were: normal with positive HPV Last mammogram: 2009. Results were: normal  Obstetric History OB History  Gravida Para Term Preterm AB Living  1 1 1     1   SAB IAB Ectopic Multiple Live Births          1    # Outcome Date GA Lbr Len/2nd Weight Sex Delivery Anes PTL Lv  1 Term 03/15/07   9 lb 10 oz (4.366 kg) F Vag-Spont  N LIV    Past Medical History:  Diagnosis Date   Cancer ([place])    skin   GERD (gastroesophageal reflux disease)     Past Surgical History:  Procedure Laterality Date   UPPER GI ENDOSCOPY      Current Outpatient Medications on File Prior to Visit  Medication Sig Dispense Refill   cyanocobalamin (,VITAMIN B-12,) 1000 MCG/ML injection Inject 1 mL (1,000 mcg total) into the muscle every 30 (thirty) days. (Patient not taking: Reported on 03/07/2020) 10 mL 1   ergocalciferol (VITAMIN D2) 1.25 MG (50000 UT) capsule Take 1 capsule (50,000 Units total) by mouth once a week. (Patient not taking: Reported on 03/07/2020) 4 capsule 5   etonogestrel-ethinyl estradiol (NUVARING) 0.12-0.015 MG/24HR vaginal ring Place 1 each vaginally every 28 (twenty-eight) days. Insert vaginally and leave in place for 3 consecutive weeks, then remove for 1 week. 1 each 12   ibuprofen (ADVIL) 600 MG tablet Take 1 tablet (600 mg total) by mouth every 8 (eight) hours as  needed for moderate pain. 20 tablet 0   methylPREDNISolone (MEDROL DOSEPAK) 4 MG TBPK tablet 6 day dose pack - take as directed (Patient not taking: Reported on 10/19/2019) 21 tablet 0   metroNIDAZOLE (METROGEL VAGINAL) 0.75 % vaginal gel Place 1 Applicatorful vaginally 2 (two) times daily. (Patient not taking: Reported on 10/19/2019) 70 g 2   nitrofurantoin, macrocrystal-monohydrate, (MACROBID) 100 MG capsule Take 1 capsule (100 mg total) by mouth 2 (two) times daily. 14 capsule 0   Norethindrone-Ethinyl Estradiol-Fe Biphas (LO LOESTRIN FE) 1 MG-10 MCG / 10 MCG tablet Take 1 tablet by mouth daily. (Patient not taking: Reported on 03/07/2020) 3 Package 3   ondansetron (ZOFRAN ODT) 4 MG disintegrating tablet Take 1 tablet (4 mg total) by mouth every 8 (eight) hours as needed for nausea or vomiting. 20 tablet 0   pantoprazole (PROTONIX) 40 MG tablet Take 40 mg by mouth daily.      phenazopyridine (PYRIDIUM) 200 MG tablet Take 1 tablet (200 mg total) by mouth 3 (three) times daily as needed for pain (urethral spasm). 10 tablet 1   Phendimetrazine Tartrate 35 MG TABS Take 1 tablet by mouth 3 (three) times daily.     phentermine 37.5 MG capsule Take 1 capsule (37.5 mg total) by mouth every morning. (Patient not taking: Reported on 10/19/2019) 30 capsule 0   No current facility-administered medications on file prior to visit.    No Known Allergies  Social History:  reports that she has never smoked. She has never used smokeless tobacco. She reports that she does not drink alcohol and does not use drugs.  Family History  Problem Relation Age of Onset   Hypertension Mother    Heart disease Father    Hypertension Father    Diabetes Father    Heart disease Brother    Hypertension Brother    Diabetes Brother    Breast cancer Neg Hx    Ovarian cancer Neg Hx    Colon cancer Neg Hx     The following portions of the patient's history were reviewed and updated as appropriate: allergies, current  medications, past family history, past medical history, past social history, past surgical history and problem list.  Review of Systems Pertinent items noted in HPI and remainder of comprehensive ROS otherwise negative.  Physical Exam:  BP 129/87    Pulse 89    Ht 5\' 7"  (1.702 m)    Wt 246 lb 14.4 oz (112 kg)    LMP 07/08/2021 (Exact Date)    BMI 38.67 kg/m  CONSTITUTIONAL: Well-developed, well-nourished female in no acute distress.  HENT:  Normocephalic, atraumatic, External right and left ear normal. Oropharynx is clear and moist EYES: Conjunctivae and EOM are normal. Pupils are equal, round, and reactive to light. No scleral icterus.  NECK: Normal range of motion, supple, no masses.  Normal thyroid.  SKIN: Skin is warm and dry. No rash noted. Not diaphoretic. No erythema. No pallor. MUSCULOSKELETAL: Normal range of motion. No tenderness.  No cyanosis, clubbing, or edema.  2+ distal pulses. NEUROLOGIC: Alert and oriented to person, place, and time. Normal reflexes, muscle tone coordination.  PSYCHIATRIC: Normal mood and affect. Normal behavior. Normal judgment and thought content. CARDIOVASCULAR: Normal heart rate noted, regular rhythm RESPIRATORY: Clear to auscultation bilaterally. Effort and breath sounds normal, no problems with respiration noted. BREASTS: Symmetric in size. No masses, tenderness, skin changes, nipple drainage, or lymphadenopathy bilaterally.  ABDOMEN: Soft, no distention noted.  No tenderness, rebound or guarding.  PELVIC: Normal appearing external genitalia and urethral meatus; normal appearing vaginal mucosa and cervix.  No abnormal discharge noted.  Pap smear obtained. Contac bleeding. Normal uterine size, no other palpable masses, no uterine or adnexal tenderness.  .   Assessment and Plan:    1. Women's annual routine gynecological examination  Pap: Will follow up results of pap smear and manage accordingly. Mammogram : orders placed Colonoscopy:  declines Labs:   none Refills: none Referral: none Routine preventative health maintenance measures emphasized. Please refer to After Visit Summary for other counseling recommendations.      Philip Aspen, CNM [facility]

## 2021-07-30 LAB — CYTOLOGY - PAP
Comment: NEGATIVE
Diagnosis: UNDETERMINED — AB
High risk HPV: NEGATIVE

## 2021-07-31 ENCOUNTER — Encounter: Payer: Self-pay | Admitting: Certified Nurse Midwife

## 2021-11-06 ENCOUNTER — Ambulatory Visit: Payer: Medicaid Other | Admitting: Obstetrics

## 2021-11-06 ENCOUNTER — Encounter: Payer: Self-pay | Admitting: Obstetrics

## 2021-11-06 VITALS — BP 123/82 | HR 106 | Ht 67.0 in | Wt 238.1 lb

## 2021-11-06 DIAGNOSIS — Z30011 Encounter for initial prescription of contraceptive pills: Secondary | ICD-10-CM

## 2021-11-06 MED ORDER — NORETHINDRONE 0.35 MG PO TABS: 1.0000 | ORAL_TABLET | Freq: Every day | ORAL | 3 refills | Status: AC

## 2021-11-06 NOTE — Progress Notes (Signed)
Subjective:  ? ? SIARAH DELEO is a 46 y.o. female who presents for contraception counseling. The patient has no complaints today. The patient is sexually active. Pertinent past medical history: none. She states that her boyfriend had a vasectomy, but she strongly desires to avoid a pregnancy. ? ?Menstrual History: ?OB History   ? ? Gravida  ?1  ? Para  ?1  ? Term  ?1  ? Preterm  ?   ? AB  ?   ? Living  ?1  ?  ? ? SAB  ?   ? IAB  ?   ? Ectopic  ?   ? Multiple  ?   ? Live Births  ?1  ?   ?  ?  ?  ?Periods are regular every month and last about 3 days. ?  ? ?The following portions of the patient's history were reviewed and updated as appropriate: allergies, current medications, past family history, past medical history, past social history, past surgical history, and problem list. ? ?Review of Systems ?Pertinent items noted in HPI and remainder of comprehensive ROS otherwise negative.  ? ?Objective:  ? ? No exam performed today,  not indicated for contraceptive counseling .  ? ?Assessment:  ? ? 46 y.o., starting oral progesterone-only contraceptive, no contraindications.  ? ?Plan:  ? ? All questions answered. We discussed all available contraceptive options. She is interested in having a consult for tubal ligation but would like to start POPs in the meantime. We discussed proper usage, risks, and benefits. Mildred had questions regarding weight loss medications. Refer to Philip Aspen, CNM for consult. ? ?Follow up for annual visit or PRN. ? ?Lloyd Huger, CNM  ?

## 2022-03-22 ENCOUNTER — Other Ambulatory Visit: Payer: Self-pay | Admitting: Internal Medicine

## 2022-03-22 DIAGNOSIS — Z1231 Encounter for screening mammogram for malignant neoplasm of breast: Secondary | ICD-10-CM

## 2022-07-05 ENCOUNTER — Telehealth: Payer: Self-pay | Admitting: Certified Nurse Midwife

## 2022-07-05 NOTE — Telephone Encounter (Signed)
I contacted patient via phone, left message for patient to call back to rescheduled 08/09/22 with AT.

## 2022-07-07 NOTE — Telephone Encounter (Signed)
I contacted patient via phone, left message for patient to call back to confirm

## 2022-07-10 ENCOUNTER — Other Ambulatory Visit: Payer: Self-pay

## 2022-07-10 ENCOUNTER — Emergency Department
Admission: EM | Admit: 2022-07-10 | Discharge: 2022-07-10 | Disposition: A | Discharge status: Home / Self Care | Payer: Medicaid Other | Attending: Emergency Medicine | Admitting: Emergency Medicine

## 2022-07-10 ENCOUNTER — Emergency Department: Payer: Medicaid Other

## 2022-07-10 DIAGNOSIS — J111 Influenza due to unidentified influenza virus with other respiratory manifestations: Secondary | ICD-10-CM | POA: Diagnosis not present

## 2022-07-10 DIAGNOSIS — Z1152 Encounter for screening for COVID-19: Secondary | ICD-10-CM | POA: Insufficient documentation

## 2022-07-10 DIAGNOSIS — R55 Syncope and collapse: Secondary | ICD-10-CM | POA: Diagnosis present

## 2022-07-10 DIAGNOSIS — J101 Influenza due to other identified influenza virus with other respiratory manifestations: Secondary | ICD-10-CM

## 2022-07-10 LAB — URINALYSIS, ROUTINE W REFLEX MICROSCOPIC
Bilirubin Urine: NEGATIVE
Glucose, UA: NEGATIVE mg/dL
Hgb urine dipstick: NEGATIVE
Ketones, ur: NEGATIVE mg/dL
Leukocytes,Ua: NEGATIVE
Nitrite: NEGATIVE
Protein, ur: NEGATIVE mg/dL
Specific Gravity, Urine: 1.01 (ref 1.005–1.030)
pH: 6 (ref 5.0–8.0)

## 2022-07-10 LAB — BASIC METABOLIC PANEL
Anion gap: 9 (ref 5–15)
BUN: 11 mg/dL (ref 6–20)
CO2: 24 mmol/L (ref 22–32)
Calcium: 8.5 mg/dL — ABNORMAL LOW (ref 8.9–10.3)
Chloride: 105 mmol/L (ref 98–111)
Creatinine, Ser: 0.72 mg/dL (ref 0.44–1.00)
GFR, Estimated: >60 mL/min (ref >60)
Glucose, Bld: 152 mg/dL — ABNORMAL HIGH (ref 70–99)
Potassium: 4.3 mmol/L (ref 3.5–5.1)
Sodium: 138 mmol/L (ref 135–145)

## 2022-07-10 LAB — CBC
HCT: 44.3 % (ref 36.0–46.0)
Hemoglobin: 14.3 g/dL (ref 12.0–15.0)
MCH: 29.7 pg (ref 26.0–34.0)
MCHC: 32.3 g/dL (ref 30.0–36.0)
MCV: 92.1 fL (ref 80.0–100.0)
Platelets: 331 10*3/uL (ref 150–400)
RBC: 4.81 MIL/uL (ref 3.87–5.11)
RDW: 12.9 % (ref 11.5–15.5)
WBC: 10.8 10*3/uL — ABNORMAL HIGH (ref 4.0–10.5)
nRBC: 0 % (ref 0.0–0.2)

## 2022-07-10 LAB — RESP PANEL BY RT-PCR (RSV, FLU A&B, COVID)  RVPGX2
Influenza A by PCR: POSITIVE — AB
Influenza B by PCR: NEGATIVE
Resp Syncytial Virus by PCR: NEGATIVE
SARS Coronavirus 2 by RT PCR: NEGATIVE

## 2022-07-10 MED ORDER — ONDANSETRON 4 MG PO TBDP
4.0000 mg | ORAL_TABLET | Freq: Once | ORAL | Status: AC
  Administered 2022-07-10: 4 mg via ORAL
  Filled 2022-07-10: qty 1

## 2022-07-10 MED ORDER — ACETAMINOPHEN 325 MG PO TABS
650.0000 mg | ORAL_TABLET | Freq: Once | ORAL | Status: AC
  Administered 2022-07-10: 650 mg via ORAL
  Filled 2022-07-10: qty 2

## 2022-07-10 MED ORDER — OSELTAMIVIR PHOSPHATE 75 MG PO CAPS
75.0000 mg | ORAL_CAPSULE | Freq: Once | ORAL | Status: DC
  Filled 2022-07-10: qty 1

## 2022-07-10 MED ORDER — ONDANSETRON 4 MG PO TBDP: 4.0000 mg | ORAL_TABLET | Freq: Three times a day (TID) | ORAL | 0 refills | Status: AC | PRN

## 2022-07-10 MED ORDER — SODIUM CHLORIDE 0.9 % IV BOLUS
1000.0000 mL | Freq: Once | INTRAVENOUS | Status: AC
  Administered 2022-07-10: 1000 mL via INTRAVENOUS

## 2022-07-10 MED ORDER — OSELTAMIVIR PHOSPHATE 75 MG PO CAPS: 75.0000 mg | ORAL_CAPSULE | Freq: Two times a day (BID) | ORAL | 0 refills | Status: AC

## 2022-07-10 NOTE — ED Triage Notes (Signed)
Pt c/o fever of 101, emesis x3, cough, and syncopal episode today. Pt denies injury with LOC. Pt reports s/s started last night, states she took a home covid test that was negative. Pt refusing covid/flu/RSV swab at this time. Pt took tylenol 1 hr ago.

## 2022-07-10 NOTE — ED Notes (Signed)
Patient is resting comfortably. 

## 2022-07-10 NOTE — ED Provider Notes (Cosign Needed Addendum)
[facility] Provider Note    Event Date/Time   First MD Initiated Contact with Patient 07/10/22 2055     (approximate)   History   Near Syncope   HPI  Briana Cabrera is a 46 y.o. female with history of GERD, sinus tachycardia and skin cancer presents emergency department with flulike symptoms.  Patient states she started having a fever of about 101 yesterday.  Has had 3 episodes of vomiting.  1 syncopal episode earlier today.  No chest pain or shortness of breath.  Patient took a home COVID test and it was negative.  Continues to complain of nausea but no abdominal pain      Physical Exam   Triage Vital Signs: ED Triage Vitals  Enc Vitals Group     BP 07/10/22 1914 120/76     Pulse Rate 07/10/22 1912 (!) 124     Resp 07/10/22 1912 18     Temp 07/10/22 1912 99.4 F (37.4 C)     Temp Source 07/10/22 1912 Oral     SpO2 07/10/22 1912 97 %     Weight --      Height --      Head Circumference --      Peak Flow --      Pain Score 07/10/22 1913 7     Pain Loc --      Pain Edu? --      Excl. in [place]? --     Most recent vital signs: Vitals:   07/10/22 1912 07/10/22 1914  BP:  120/76  Pulse: (!) 124   Resp: 18   Temp: 99.4 F (37.4 C)   SpO2: 97%      General: Awake, no distress.   CV:  Good peripheral perfusion. regular rate and  rhythm Resp:  Normal effort. Lungs crackles noted in lower lung fields bilaterally Abd:  No distention.  Nontender, bowel sounds normal all 4 quadrants Other:      ED Results / Procedures / Treatments   Labs (all labs ordered are listed, but only abnormal results are displayed) Labs Reviewed  RESP PANEL BY RT-PCR (RSV, FLU A&B, COVID)  RVPGX2 - Abnormal; Notable for the following components:      Result Value   Influenza A by PCR POSITIVE (*)    All other components within normal limits  BASIC METABOLIC PANEL - Abnormal; Notable for the following components:   Glucose, Bld 152 (*)    Calcium 8.5 (*)     All other components within normal limits  CBC - Abnormal; Notable for the following components:   WBC 10.8 (*)    All other components within normal limits  URINALYSIS, ROUTINE W REFLEX MICROSCOPIC - Abnormal; Notable for the following components:   Color, Urine STRAW (*)    APPearance CLEAR (*)    All other components within normal limits  CBG MONITORING, ED     EKG  EKG   RADIOLOGY Chest x-ray    PROCEDURES:   Procedures   MEDICATIONS ORDERED IN ED: Medications  acetaminophen (TYLENOL) tablet 650 mg (has no administration in time range)  oseltamivir (TAMIFLU) capsule 75 mg (has no administration in time range)  ondansetron (ZOFRAN-ODT) disintegrating tablet 4 mg (has no administration in time range)  ondansetron (ZOFRAN-ODT) disintegrating tablet 4 mg (4 mg Oral Given 07/10/22 1918)  sodium chloride 0.9 % bolus 1,000 mL (0 mLs Intravenous Stopped 07/10/22 2256)     IMPRESSION / MDM / ASSESSMENT AND PLAN /  ED COURSE  I reviewed the triage vital signs and the nursing notes.                              Differential diagnosis includes, but is not limited to, CAP, influenza, COVID, RSV, viral entity  Patient's presentation is most consistent with acute complicated illness / injury requiring diagnostic workup.   Patient is concerned that she is becoming dehydrated so with her normal saline 1 L IV, she was given Zofran ODT in triage and appears to be feeling better from the nausea  Labs are reassuring, CBC has mild elevation WBC of 06.2, basic metabolic panel is normal,  Respiratory panel is positive for influenza A  Chest x-ray independently reviewed and interpreted by me as being negative for any acute abnormality  Patient was given 1 L normal saline and is feeling a little better but is still nauseated.  She was given another Zofran ODT as she is becoming more nauseated, will give her prescription for Tamiflu.  She is to follow-up with her regular doctor if  not improving to 3 days.  Return emergency department worsening.  She was discharged stable condition   Patient's heart rate was still slightly elevated, she has a fever so I feel that tachycardia is most likely due to the fever and influenza.  She did not want to finish her fluids as her arm is cold from the fluids.  She will be discharged in stable condition  FINAL CLINICAL IMPRESSION(S) / ED DIAGNOSES   Final diagnoses:  Influenza A     Rx / DC Orders   ED Discharge Orders          Ordered    oseltamivir (TAMIFLU) 75 MG capsule  2 times daily        07/10/22 2310    ondansetron (ZOFRAN-ODT) 4 MG disintegrating tablet  Every 8 hours PRN        07/10/22 2310             Note:  This document was prepared using Dragon voice recognition software and may include unintentional dictation errors.    Versie Starks, PA-C 07/10/22 2317    Versie Starks, PA-C 07/10/22 Janus Molder    Duffy Bruce, MD 07/13/22 [phone]

## 2022-08-09 ENCOUNTER — Encounter: Payer: Medicaid Other | Admitting: Certified Nurse Midwife

## 2022-12-10 ENCOUNTER — Ambulatory Visit: Payer: Medicaid Other | Admitting: Podiatry

## 2022-12-31 ENCOUNTER — Ambulatory Visit: Payer: Medicaid Other | Admitting: Podiatry

## 2022-12-31 ENCOUNTER — Ambulatory Visit: Payer: Medicaid Other

## 2022-12-31 DIAGNOSIS — M722 Plantar fascial fibromatosis: Secondary | ICD-10-CM

## 2022-12-31 NOTE — Progress Notes (Signed)
   Chief Complaint  Patient presents with   Plantar Fasciitis    New pt-est pt last seen 2020 problems with planter fascitis flare up of both feet - she does ok in sneakers with heel pads in them - she wants to talk about good shoe gear for her feet - sneakers, flip flops, sandals    Subjective: 47 year old female presenting today as a reestablish new patient for evaluation of intermittent plantar fasciitis to the bilateral feet that is been ongoing for several years.  Patient states that she has occasional flareups of her plantar fasciitis depending on her activity and shoe gear.  On average about 2 times per year.  She would like to discuss different conservative treatment options and shoes to help alleviate her heel pain  Past Medical History:  Diagnosis Date   Cancer (HCC)    skin   GERD (gastroesophageal reflux disease)    Past Surgical History:  Procedure Laterality Date   UPPER GI ENDOSCOPY     No Known Allergies  Objective: Physical Exam General: The patient is alert and oriented x3 in no acute distress.  Dermatology: Skin is warm, dry and supple bilateral lower extremities. Negative for open lesions or macerations bilateral.   Vascular: Dorsalis Pedis and Posterior Tibial pulses palpable bilateral.  Capillary fill time is immediate to all digits.  Neurological: Grossly intact via light touch  Musculoskeletal: Tenderness to palpation to the plantar aspect of the bilateral heels along the plantar fascia as well as to the insertion of the peroneal tendon of the bilateral feet. All other joints range of motion within normal limits bilateral. Strength 5/5 in all groups bilateral.   Assessment: 1. plantar fasciitis bilateral feet  -Patient evaluated -Today we discussed different conservative treatment options for the plantar fasciitis specifically appropriate shoe gear and advised against going barefoot.  Recommend good supportive shoes and sneakers with arch supports and  sandals with arch supports, vionics or oofos -Recommend daily stretching exercises -Return to clinic as needed   Felecia Shelling, DPM [facility]  Dr. Felecia Shelling, DPM    [address] [place], [place] [address]                Office [phone]  Fax [phone]

## 2023-04-20 ENCOUNTER — Other Ambulatory Visit: Payer: Self-pay | Admitting: Internal Medicine

## 2023-04-20 DIAGNOSIS — Z1231 Encounter for screening mammogram for malignant neoplasm of breast: Secondary | ICD-10-CM

## 2023-08-26 ENCOUNTER — Ambulatory Visit
Admission: RE | Admit: 2023-08-26 | Discharge: 2023-08-26 | Disposition: A | Discharge status: Home / Self Care | Payer: Medicaid Other | Source: Ambulatory Visit | Attending: Internal Medicine | Admitting: Internal Medicine

## 2023-08-26 DIAGNOSIS — Z1231 Encounter for screening mammogram for malignant neoplasm of breast: Secondary | ICD-10-CM | POA: Insufficient documentation

## 2023-09-01 ENCOUNTER — Other Ambulatory Visit: Payer: Self-pay | Admitting: Internal Medicine

## 2023-09-01 DIAGNOSIS — R928 Other abnormal and inconclusive findings on diagnostic imaging of breast: Secondary | ICD-10-CM

## 2023-09-09 ENCOUNTER — Other Ambulatory Visit: Payer: Medicaid Other

## 2023-09-09 ENCOUNTER — Inpatient Hospital Stay: Admission: RE | Admit: 2023-09-09 | Payer: Medicaid Other | Source: Ambulatory Visit

## 2023-09-16 ENCOUNTER — Ambulatory Visit
Admission: RE | Admit: 2023-09-16 | Discharge: 2023-09-16 | Disposition: A | Discharge status: Home / Self Care | Payer: Medicaid Other | Source: Ambulatory Visit | Attending: Internal Medicine | Admitting: Internal Medicine

## 2023-09-16 DIAGNOSIS — R928 Other abnormal and inconclusive findings on diagnostic imaging of breast: Secondary | ICD-10-CM | POA: Diagnosis present

## 2023-09-25 ENCOUNTER — Emergency Department
Admission: EM | Admit: 2023-09-25 | Discharge: 2023-09-25 | Disposition: A | Discharge status: Home / Self Care | Attending: Emergency Medicine | Admitting: Emergency Medicine

## 2023-09-25 ENCOUNTER — Emergency Department

## 2023-09-25 ENCOUNTER — Other Ambulatory Visit: Payer: Self-pay

## 2023-09-25 DIAGNOSIS — R002 Palpitations: Secondary | ICD-10-CM | POA: Insufficient documentation

## 2023-09-25 DIAGNOSIS — R0789 Other chest pain: Secondary | ICD-10-CM | POA: Diagnosis present

## 2023-09-25 LAB — BASIC METABOLIC PANEL
Anion gap: 9 (ref 5–15)
BUN: 16 mg/dL (ref 6–20)
CO2: 24 mmol/L (ref 22–32)
Calcium: 9 mg/dL (ref 8.9–10.3)
Chloride: 102 mmol/L (ref 98–111)
Creatinine, Ser: 0.9 mg/dL (ref 0.44–1.00)
GFR, Estimated: >60 mL/min (ref >60)
Glucose, Bld: 102 mg/dL — ABNORMAL HIGH (ref 70–99)
Potassium: 3.9 mmol/L (ref 3.5–5.1)
Sodium: 135 mmol/L (ref 135–145)

## 2023-09-25 LAB — CBC
HCT: 41.2 % (ref 36.0–46.0)
Hemoglobin: 13.7 g/dL (ref 12.0–15.0)
MCH: 29.3 pg (ref 26.0–34.0)
MCHC: 33.3 g/dL (ref 30.0–36.0)
MCV: 88.2 fL (ref 80.0–100.0)
Platelets: 334 10*3/uL (ref 150–400)
RBC: 4.67 MIL/uL (ref 3.87–5.11)
RDW: 12.7 % (ref 11.5–15.5)
WBC: 8 10*3/uL (ref 4.0–10.5)
nRBC: 0 % (ref 0.0–0.2)

## 2023-09-25 LAB — POC URINE PREG, ED: Preg Test, Ur: NEGATIVE

## 2023-09-25 LAB — TROPONIN I (HIGH SENSITIVITY): Troponin I (High Sensitivity): 3 ng/L (ref <18)

## 2023-09-25 MED ORDER — IOHEXOL 350 MG/ML SOLN
75.0000 mL | Freq: Once | INTRAVENOUS | Status: AC | PRN
Start: 2023-09-25 — End: 2023-09-25
  Administered 2023-09-25: 75 mL via INTRAVENOUS

## 2023-09-25 MED ORDER — ONDANSETRON 4 MG PO TBDP: 8.0000 mg | ORAL_TABLET | Freq: Once | ORAL | Status: DC

## 2023-09-25 NOTE — ED Triage Notes (Signed)
 Pt to ed from home via POV for CP and HTN x 2 days. Pt advised she feels like her HR is racing more than having actual CP. Pt is caox4, in no acute distress and ambulatory in triage,

## 2023-09-25 NOTE — ED Provider Notes (Signed)
 [facility] Provider Note    Event Date/Time   First MD Initiated Contact with Patient 09/25/23 2129     (approximate)   History   Chest Pain   HPI  Briana Cabrera is a 48 y.o. female with a history of acid reflux who presents with complaints of chest discomfort, rapid heart rate, she reports this has been ongoing over the last week or so, she has noticed that her blood pressure has been elevated to which is very typical for her.  No recent travel.  No calf pain or swelling.  No fevers chills or cough     Physical Exam   Triage Vital Signs: ED Triage Vitals  Encounter Vitals Group     BP 09/25/23 2042 (!) 157/102     Systolic BP Percentile --      Diastolic BP Percentile --      Pulse Rate 09/25/23 2042 (!) 108     Resp 09/25/23 2042 18     Temp 09/25/23 2042 97.8 F (36.6 C)     Temp Source 09/25/23 2042 Oral     SpO2 09/25/23 2042 99 %     Weight --      Height 09/25/23 2044 1.702 m (5\' 7" )     Head Circumference --      Peak Flow --      Pain Score 09/25/23 2044 0     Pain Loc --      Pain Education --      Exclude from Growth Chart --     Most recent vital signs: Vitals:   09/25/23 2042 09/25/23 2215  BP: (!) 157/102 (!) 149/86  Pulse: (!) 108 99  Resp: 18 16  Temp: 97.8 F (36.6 C)   SpO2: 99% 99%     General: Awake, no distress.  CV:  Good peripheral perfusion.  Regular rate and rhythm, no murmur Resp:  Normal effort.  Clear to auscultation bilaterally Abd:  No distention.  Other:  No calf pain or swelling   ED Results / Procedures / Treatments   Labs (all labs ordered are listed, but only abnormal results are displayed) Labs Reviewed  BASIC METABOLIC PANEL - Abnormal; Notable for the following components:      Result Value   Glucose, Bld 102 (*)    All other components within normal limits  CBC  POC URINE PREG, ED  TROPONIN I (HIGH SENSITIVITY)     EKG  ED ECG REPORT I, Jene Every, the attending  physician, personally viewed and interpreted this ECG.  Date: 09/25/2023  Rhythm: normal sinus rhythm QRS Axis: normal Intervals: normal ST/T Wave abnormalities: Nonspecific T wave change Narrative Interpretation: no evidence of acute ischemia    RADIOLOGY Chest x-ray viewed and interpret by me, no acute abnormality    PROCEDURES:  Critical Care performed:   Procedures   MEDICATIONS ORDERED IN ED: Medications  iohexol (OMNIPAQUE) 350 MG/ML injection 75 mL (75 mLs Intravenous Contrast Given 09/25/23 2229)     IMPRESSION / MDM / ASSESSMENT AND PLAN / ED COURSE  I reviewed the triage vital signs and the nursing notes. Patient's presentation is most consistent with acute presentation with potential threat to life or bodily function.  Patient presents with chest pressure, palpitations as described above.  She reports this has been occurring intermittently over the last 2 weeks.  She also complains of elevated blood pressure as detailed above.  Overall well-appearing and in no acute distress here, heart  rate is mildly elevated, blood pressure 140s over 80s.  EKG with nonspecific changes, high sensitive troponin is normal.  Lab work otherwise reassuring, chest x-ray without acute normality, will send for CT angiography to rule out PE.  CT angiography negative for PE, patient is feeling improved, appropriate for discharge at this time, no indication for admission, she will follow-up with cardiology.        FINAL CLINICAL IMPRESSION(S) / ED DIAGNOSES   Final diagnoses:  Atypical chest pain  Palpitations     Rx / DC Orders   ED Discharge Orders          Ordered    Ambulatory referral to Cardiology       Comments: If you have not heard from the Cardiology office within the next 72 hours please call [phone].   09/25/23 2303             Note:  This document was prepared using Dragon voice recognition software and may include unintentional dictation errors.    Jene Every, MD 09/25/23 [phone]

## 2023-12-16 ENCOUNTER — Ambulatory Visit: Admitting: Cardiovascular Disease
